# Patient Record
Sex: Female | Born: 1995 | Hispanic: Yes | Marital: Single | Smoking: Never smoker
Health system: Southern US, Community
[De-identification: ages and names within clinical notes are randomized; demographics above are authoritative.]

## PROBLEM LIST (undated history)

## (undated) DIAGNOSIS — E669 Obesity, unspecified: Secondary | ICD-10-CM

## (undated) DIAGNOSIS — R7303 Prediabetes: Secondary | ICD-10-CM

## (undated) HISTORY — DX: Prediabetes: R73.03

---

## 2018-12-23 ENCOUNTER — Emergency Department: Payer: HRSA Program

## 2018-12-23 ENCOUNTER — Emergency Department
Admission: EM | Admit: 2018-12-23 | Discharge: 2018-12-23 | Disposition: A | Discharge status: Home / Self Care | Payer: HRSA Program | Attending: Student | Admitting: Student

## 2018-12-23 ENCOUNTER — Other Ambulatory Visit: Payer: Self-pay

## 2018-12-23 DIAGNOSIS — U071 COVID-19: Secondary | ICD-10-CM | POA: Insufficient documentation

## 2018-12-23 DIAGNOSIS — R0602 Shortness of breath: Secondary | ICD-10-CM | POA: Diagnosis present

## 2018-12-23 DIAGNOSIS — R509 Fever, unspecified: Secondary | ICD-10-CM

## 2018-12-23 DIAGNOSIS — Z20822 Contact with and (suspected) exposure to covid-19: Secondary | ICD-10-CM

## 2018-12-23 LAB — CBC WITH DIFFERENTIAL/PLATELET
Abs Immature Granulocytes: 0.01 10*3/uL (ref 0.00–0.07)
Basophils Absolute: 0 10*3/uL (ref 0.0–0.1)
Basophils Relative: 1 %
Eosinophils Absolute: 0 10*3/uL (ref 0.0–0.5)
Eosinophils Relative: 0 %
HCT: 45.2 % (ref 36.0–46.0)
Hemoglobin: 14.9 g/dL (ref 12.0–15.0)
Immature Granulocytes: 0 %
Lymphocytes Relative: 37 %
Lymphs Abs: 1 10*3/uL (ref 0.7–4.0)
MCH: 28.8 pg (ref 26.0–34.0)
MCHC: 33 g/dL (ref 30.0–36.0)
MCV: 87.4 fL (ref 80.0–100.0)
Monocytes Absolute: 0.2 10*3/uL (ref 0.1–1.0)
Monocytes Relative: 8 %
Neutro Abs: 1.5 10*3/uL — ABNORMAL LOW (ref 1.7–7.7)
Neutrophils Relative %: 54 %
Platelets: 163 10*3/uL (ref 150–400)
RBC: 5.17 MIL/uL — ABNORMAL HIGH (ref 3.87–5.11)
RDW: 13.4 % (ref 11.5–15.5)
WBC: 2.7 10*3/uL — ABNORMAL LOW (ref 4.0–10.5)
nRBC: 0 % (ref 0.0–0.2)

## 2018-12-23 LAB — BASIC METABOLIC PANEL
Anion gap: 7 (ref 5–15)
BUN: 6 mg/dL (ref 6–20)
CO2: 24 mmol/L (ref 22–32)
Calcium: 8.5 mg/dL — ABNORMAL LOW (ref 8.9–10.3)
Chloride: 107 mmol/L (ref 98–111)
Creatinine, Ser: 0.55 mg/dL (ref 0.44–1.00)
GFR calc Af Amer: >60 mL/min (ref >60)
GFR calc non Af Amer: >60 mL/min (ref >60)
Glucose, Bld: 127 mg/dL — ABNORMAL HIGH (ref 70–99)
Potassium: 3.5 mmol/L (ref 3.5–5.1)
Sodium: 138 mmol/L (ref 135–145)

## 2018-12-23 LAB — POCT PREGNANCY, URINE: Preg Test, Ur: NEGATIVE

## 2018-12-23 MED ORDER — BENZONATATE 100 MG PO CAPS: 100.0000 mg | ORAL_CAPSULE | Freq: Three times a day (TID) | ORAL | 0 refills | Status: AC | PRN

## 2018-12-23 MED ORDER — AZITHROMYCIN 250 MG PO TABS: ORAL_TABLET | ORAL | 0 refills | Status: DC

## 2018-12-23 MED ORDER — ACETAMINOPHEN 325 MG PO TABS
650.0000 mg | ORAL_TABLET | Freq: Once | ORAL | Status: AC | PRN
  Administered 2018-12-23: 13:00:00 650 mg via ORAL
  Filled 2018-12-23: qty 2

## 2018-12-23 MED ORDER — IBUPROFEN 600 MG PO TABS
600.0000 mg | ORAL_TABLET | Freq: Once | ORAL | Status: AC
  Administered 2018-12-23: 600 mg via ORAL
  Filled 2018-12-23: qty 1

## 2018-12-23 NOTE — ED Provider Notes (Addendum)
[facility] Emergency Department Provider Note  ____________________________________________   First MD Initiated Contact with Patient 12/23/18 1341     (approximate)  I have reviewed the triage vital signs and the nursing notes.  History  Chief Complaint Shortness of Breath    HPI Nancy Weeks is a 23 y.o. female with no significant medical history presents to the emergency department for fevers, body aches, headache, shortness of breath, and non-productive cough in the context of a known COVID positive exposure.  Patient states her symptoms started on Sunday, 3 days ago.  Her step father, who lives in the same household and whom she has been around, tested positive for COVID approximately 2 weeks ago.  She denies any vomiting or diarrhea.  No history of DVT or PE.  She has not tried anything for her symptoms.         Past Medical Hx History reviewed. No pertinent past medical history.  Problem List There are no active problems to display for this patient.   Past Surgical Hx History reviewed. No pertinent surgical history.  Medications Prior to Admission medications   Medication Sig Start Date End Date Taking? Authorizing Provider  azithromycin (ZITHROMAX Z-PAK) 250 MG tablet Take 2 tablets (500 mg) on  Day 1,  followed by 1 tablet (250 mg) once daily on Days 2 through 5. 12/23/18 12/28/18  Miguel AschoffMonks,  L., MD    Allergies Patient has no known allergies.  Family Hx No family history on file.  Social Hx Social History   Tobacco Use  . Smoking status: Never Smoker  . Smokeless tobacco: Never Used  Substance Use Topics  . Alcohol use: Not Currently  . Drug use: Not Currently     Review of Systems  Constitutional: + for fever and body ache Eyes: Negative for visual changes. ENT: Negative for sore throat. Cardiovascular: Negative for chest pain. Respiratory: + for shortness of breath and cough. Gastrointestinal: Negative  for abdominal pain. Negative for nausea. Negative for vomiting. Genitourinary: Negative for dysuria. Musculoskeletal: Negative for leg swelling. Skin: Negative for rash. Neurological: + for for headaches.   Physical Exam  Vital Signs: ED Triage Vitals  Enc Vitals Group     BP 12/23/18 1243 (!) 143/82     Pulse Rate 12/23/18 1243 (!) 133     Resp 12/23/18 1243 (!) 48     Temp 12/23/18 1243 (!) 101.1 F (38.4 C)     Temp Source 12/23/18 1243 Oral     SpO2 12/23/18 1243 92 %     Weight 12/23/18 1243 280 lb (127 kg)     Height 12/23/18 1243 5\' 2"  (1.575 m)     Head Circumference --      Peak Flow --      Pain Score 12/23/18 1246 0     Pain Loc --      Pain Edu? --      Excl. in GC? --     Constitutional: Alert and oriented. Overweight.  Eyes: Conjunctivae clear. Sclera anicteric. Head: Normocephalic. Atraumatic. Nose: No congestion. No rhinorrhea. Mouth/Throat: MM moist.  Neck: No stridor.   Cardiovascular: Tachycardic, regular rhythm. No murmurs. Extremities well perfused. Respiratory: Mild tachypnea, but speaking in full sentences. No accessory muscle use. No respiratory distress.  Lungs clear bilaterally, anteriorly. Gastrointestinal: Soft and non-tender. No distention.  Musculoskeletal: No lower extremity edema or asymmetry. Neurologic:  Normal speech and language. No gross focal neurologic deficits are appreciated.  Skin: Skin is warm,  dry and intact. No rash noted. Psychiatric: Mood and affect are appropriate for situation.  EKG  Personally reviewed.   Rate: tachycardic, 130 Rhythm: sinus Axis: normal Intervals: within normal limits No acute ischemic changes Low voltage, perhaps 2/2 body type/habitus   Radiology  XR: IMPRESSION: Elevation of the right hemidiaphragm with adjacent hazy opacity likely reflective of atelectasis though can not exclude underlying consolidation/infection.   Procedures  Procedure(s) performed (including critical care):   Procedures   Initial Impression / Assessment and Plan / ED Course  23 y.o. female with no significant medical history who presents to the ED for fever, body ache, shortness of breath, cough x 3 days in the setting of a known COVID exposure.  On initial arrival, patient is febrile with an associated tachycardia and tachypnea.  Initial oxygen saturation 92%, however on recheck without intervention or supplemental oxygen she maintains oxygen saturation greater than 97% on room air throughout her ED stay.  Lungs clear, able to speak in full sentences, no accessory muscle use, and not in any respiratory distress.   Highest concern for COVID given her symptoms and exposure.  We will swab and obtain XR. Symptom control, reassess. No hx of DVT/PE, recent travel or immobilization, or leg swelling suggestive of PE.  After anti-pyretics her temperature, heart rate, and respiratory rate improved.  Labs without actionable derangements.  X-ray with questionable right sided consolidation. Given the possible consolidation on x-ray, will opt to treat with a course of azithromycin.    Vitals improved, at this time the patient feels comfortable (and prefers) going home.  Given her improvement (temp, HR, and RR) with antipyretics, as well as no evidence of respiratory distress or hypoxia, feel discharge is appropriate at this time. Discussed strict return precautions.  Patient voices understanding and is comfortable with the plan and discharge.   Final Clinical Impression(s) / ED Diagnosis  Final diagnoses:  SOB (shortness of breath)  Suspected Covid-19 Virus Infection  Fever in adult    Note:  This document was prepared using Dragon voice recognition software and may include unintentional dictation errors.     Lilia Pro., MD 12/23/18 1520

## 2018-12-23 NOTE — Discharge Instructions (Addendum)
Thank you for letting us take care of you in the emergency department today.   At this time, your coronavirus swab results are pending. You should hear your results in 2-4 days if they are positive.   In the meantime, it is important to take precautions in case you are positive. This includes quarantining, wearing a mask, and social distancing.   Continue to take over the counter acetaminophen (Tylenol) as directed on the box to help with fevers as well as aches and pains. It may help to take this on a scheduled basis every 4-6 hours for the next several days.  We will also send you home with an antibiotic called azithromycin, for a possible pneumonia on your chest x-ray, please take as directed for the entire course.  Please return to the ER for any new or worsening symptoms, such as difficulty breathing, vomiting and diarrhea, or chest pain.

## 2018-12-23 NOTE — ED Notes (Signed)
Pt states having fevers since yesterday and a cough that comes from feeling sob. Pt also states having headaches for the last few days and taking OTC meds for her symptoms. Father known to have covid around 2 weeks ago.

## 2018-12-23 NOTE — ED Triage Notes (Signed)
Pt c/o having a cough with SOB since Friday. Today febrile in triage 101.1, ST 133. States her step father that lives in the same household tested positive for covid about 2 weeks ago.

## 2018-12-24 LAB — NOVEL CORONAVIRUS, NAA (HOSP ORDER, SEND-OUT TO REF LAB; TAT 18-24 HRS): SARS-CoV-2, NAA: DETECTED — AB

## 2018-12-25 ENCOUNTER — Other Ambulatory Visit: Payer: Self-pay

## 2018-12-25 ENCOUNTER — Emergency Department
Admission: EM | Admit: 2018-12-25 | Discharge: 2018-12-25 | Disposition: A | Discharge status: Other Acute Inpatient Hospital | Payer: HRSA Program | Attending: Student in an Organized Health Care Education/Training Program | Admitting: Student in an Organized Health Care Education/Training Program

## 2018-12-25 ENCOUNTER — Emergency Department: Payer: HRSA Program

## 2018-12-25 ENCOUNTER — Encounter: Payer: Self-pay | Admitting: Emergency Medicine

## 2018-12-25 ENCOUNTER — Inpatient Hospital Stay (HOSPITAL_COMMUNITY)
Admission: AD | Admit: 2018-12-25 | Discharge: 2018-12-30 | DRG: 177 | Disposition: A | Discharge status: Home / Self Care | Payer: HRSA Program | Source: Other Acute Inpatient Hospital | Attending: Internal Medicine | Admitting: Internal Medicine

## 2018-12-25 ENCOUNTER — Encounter (HOSPITAL_COMMUNITY): Payer: Self-pay

## 2018-12-25 DIAGNOSIS — J1289 Other viral pneumonia: Secondary | ICD-10-CM | POA: Diagnosis present

## 2018-12-25 DIAGNOSIS — R0789 Other chest pain: Secondary | ICD-10-CM | POA: Insufficient documentation

## 2018-12-25 DIAGNOSIS — J1282 Pneumonia due to coronavirus disease 2019: Secondary | ICD-10-CM

## 2018-12-25 DIAGNOSIS — U071 COVID-19: Secondary | ICD-10-CM

## 2018-12-25 DIAGNOSIS — J9601 Acute respiratory failure with hypoxia: Secondary | ICD-10-CM | POA: Diagnosis present

## 2018-12-25 DIAGNOSIS — R6510 Systemic inflammatory response syndrome (SIRS) of non-infectious origin without acute organ dysfunction: Secondary | ICD-10-CM | POA: Diagnosis present

## 2018-12-25 DIAGNOSIS — R7989 Other specified abnormal findings of blood chemistry: Secondary | ICD-10-CM | POA: Diagnosis not present

## 2018-12-25 DIAGNOSIS — R0602 Shortness of breath: Secondary | ICD-10-CM | POA: Diagnosis present

## 2018-12-25 DIAGNOSIS — Z6841 Body Mass Index (BMI) 40.0 and over, adult: Secondary | ICD-10-CM | POA: Diagnosis present

## 2018-12-25 DIAGNOSIS — K760 Fatty (change of) liver, not elsewhere classified: Secondary | ICD-10-CM | POA: Diagnosis present

## 2018-12-25 HISTORY — DX: Acute respiratory failure with hypoxia: J96.01

## 2018-12-25 HISTORY — DX: Systemic inflammatory response syndrome (sirs) of non-infectious origin without acute organ dysfunction: R65.10

## 2018-12-25 HISTORY — DX: Obesity, unspecified: E66.9

## 2018-12-25 HISTORY — DX: COVID-19: U07.1

## 2018-12-25 LAB — CBC WITH DIFFERENTIAL/PLATELET
Abs Immature Granulocytes: 0.01 10*3/uL (ref 0.00–0.07)
Basophils Absolute: 0 10*3/uL (ref 0.0–0.1)
Basophils Relative: 1 %
Eosinophils Absolute: 0 10*3/uL (ref 0.0–0.5)
Eosinophils Relative: 0 %
HCT: 42.4 % (ref 36.0–46.0)
Hemoglobin: 14 g/dL (ref 12.0–15.0)
Immature Granulocytes: 0 %
Lymphocytes Relative: 31 %
Lymphs Abs: 1.1 10*3/uL (ref 0.7–4.0)
MCH: 28.4 pg (ref 26.0–34.0)
MCHC: 33 g/dL (ref 30.0–36.0)
MCV: 86 fL (ref 80.0–100.0)
Monocytes Absolute: 0.3 10*3/uL (ref 0.1–1.0)
Monocytes Relative: 10 %
Neutro Abs: 1.9 10*3/uL (ref 1.7–7.7)
Neutrophils Relative %: 58 %
Platelets: 172 10*3/uL (ref 150–400)
RBC: 4.93 MIL/uL (ref 3.87–5.11)
RDW: 13.7 % (ref 11.5–15.5)
WBC: 3.3 10*3/uL — ABNORMAL LOW (ref 4.0–10.5)
nRBC: 0 % (ref 0.0–0.2)

## 2018-12-25 LAB — FERRITIN: Ferritin: 118 ng/mL (ref 11–307)

## 2018-12-25 LAB — COMPREHENSIVE METABOLIC PANEL
ALT: 70 U/L — ABNORMAL HIGH (ref 0–44)
AST: 44 U/L — ABNORMAL HIGH (ref 15–41)
Albumin: 4 g/dL (ref 3.5–5.0)
Alkaline Phosphatase: 63 U/L (ref 38–126)
Anion gap: 10 (ref 5–15)
BUN: 6 mg/dL (ref 6–20)
CO2: 24 mmol/L (ref 22–32)
Calcium: 8.4 mg/dL — ABNORMAL LOW (ref 8.9–10.3)
Chloride: 105 mmol/L (ref 98–111)
Creatinine, Ser: 0.55 mg/dL (ref 0.44–1.00)
GFR calc Af Amer: >60 mL/min (ref >60)
GFR calc non Af Amer: >60 mL/min (ref >60)
Glucose, Bld: 119 mg/dL — ABNORMAL HIGH (ref 70–99)
Potassium: 3.5 mmol/L (ref 3.5–5.1)
Sodium: 139 mmol/L (ref 135–145)
Total Bilirubin: 0.5 mg/dL (ref 0.3–1.2)
Total Protein: 7.6 g/dL (ref 6.5–8.1)

## 2018-12-25 LAB — PROCALCITONIN: Procalcitonin: <0.1 ng/mL

## 2018-12-25 LAB — FIBRIN DERIVATIVES D-DIMER (ARMC ONLY): Fibrin derivatives D-dimer (ARMC): 646.18 ng/mL (FEU) — ABNORMAL HIGH (ref 0.00–499.00)

## 2018-12-25 LAB — C-REACTIVE PROTEIN: CRP: 2.7 mg/dL — ABNORMAL HIGH (ref <1.0)

## 2018-12-25 LAB — LACTIC ACID, PLASMA: Lactic Acid, Venous: 1 mmol/L (ref 0.5–1.9)

## 2018-12-25 MED ORDER — ZINC SULFATE 220 (50 ZN) MG PO CAPS
220.0000 mg | ORAL_CAPSULE | Freq: Every day | ORAL | Status: DC
  Administered 2018-12-25 – 2018-12-30 (×6): 220 mg via ORAL
  Filled 2018-12-25 (×6): qty 1

## 2018-12-25 MED ORDER — SODIUM CHLORIDE 0.9 % IV SOLN
100.0000 mg | INTRAVENOUS | Status: AC
  Administered 2018-12-26 – 2018-12-29 (×4): 100 mg via INTRAVENOUS
  Filled 2018-12-25 (×5): qty 20

## 2018-12-25 MED ORDER — SODIUM CHLORIDE 0.9 % IV SOLN
200.0000 mg | Freq: Once | INTRAVENOUS | Status: AC
  Administered 2018-12-25: 200 mg via INTRAVENOUS
  Filled 2018-12-25: qty 40

## 2018-12-25 MED ORDER — VITAMIN C 500 MG PO TABS
500.0000 mg | ORAL_TABLET | Freq: Every day | ORAL | Status: DC
  Administered 2018-12-25 – 2018-12-30 (×6): 500 mg via ORAL
  Filled 2018-12-25 (×6): qty 1

## 2018-12-25 MED ORDER — IOHEXOL 350 MG/ML SOLN
75.0000 mL | Freq: Once | INTRAVENOUS | Status: AC | PRN
  Administered 2018-12-25: 75 mL via INTRAVENOUS

## 2018-12-25 MED ORDER — ACETAMINOPHEN 325 MG PO TABS
650.0000 mg | ORAL_TABLET | Freq: Four times a day (QID) | ORAL | Status: DC | PRN
  Administered 2018-12-25: 650 mg via ORAL

## 2018-12-25 MED ORDER — ALBUTEROL SULFATE HFA 108 (90 BASE) MCG/ACT IN AERS
2.0000 | INHALATION_SPRAY | Freq: Once | RESPIRATORY_TRACT | Status: AC
  Administered 2018-12-25: 2 via RESPIRATORY_TRACT
  Filled 2018-12-25: qty 6.7

## 2018-12-25 MED ORDER — ONDANSETRON HCL 4 MG PO TABS: 4.0000 mg | ORAL_TABLET | Freq: Four times a day (QID) | ORAL | Status: DC | PRN

## 2018-12-25 MED ORDER — AZITHROMYCIN 500 MG PO TABS
ORAL_TABLET | ORAL | Status: AC
  Filled 2018-12-25: qty 1

## 2018-12-25 MED ORDER — HYDROCOD POLST-CPM POLST ER 10-8 MG/5ML PO SUER
5.0000 mL | Freq: Two times a day (BID) | ORAL | Status: DC | PRN
  Administered 2018-12-26 – 2018-12-28 (×5): 5 mL via ORAL
  Filled 2018-12-25 (×5): qty 5

## 2018-12-25 MED ORDER — SODIUM CHLORIDE 0.9 % IV SOLN
INTRAVENOUS | Status: AC
  Administered 2018-12-25 – 2018-12-26 (×2): via INTRAVENOUS

## 2018-12-25 MED ORDER — SODIUM CHLORIDE 0.9 % IV BOLUS
500.0000 mL | Freq: Once | INTRAVENOUS | Status: AC
  Administered 2018-12-25: 13:00:00 500 mL via INTRAVENOUS

## 2018-12-25 MED ORDER — CLONAZEPAM 0.5 MG PO TABS
0.5000 mg | ORAL_TABLET | Freq: Two times a day (BID) | ORAL | Status: DC | PRN
  Administered 2018-12-26 – 2018-12-28 (×4): 0.5 mg via ORAL
  Filled 2018-12-25 (×4): qty 1

## 2018-12-25 MED ORDER — SODIUM CHLORIDE 0.9% FLUSH
3.0000 mL | Freq: Two times a day (BID) | INTRAVENOUS | Status: DC
  Administered 2018-12-26 – 2018-12-30 (×9): 3 mL via INTRAVENOUS

## 2018-12-25 MED ORDER — ENOXAPARIN SODIUM 60 MG/0.6ML ~~LOC~~ SOLN
60.0000 mg | SUBCUTANEOUS | Status: DC
  Administered 2018-12-25 – 2018-12-28 (×4): 60 mg via SUBCUTANEOUS
  Filled 2018-12-25 (×4): qty 0.6

## 2018-12-25 MED ORDER — METHYLPREDNISOLONE SODIUM SUCC 125 MG IJ SOLR
60.0000 mg | Freq: Two times a day (BID) | INTRAMUSCULAR | Status: DC
  Administered 2018-12-25 – 2018-12-28 (×6): 60 mg via INTRAVENOUS
  Filled 2018-12-25 (×6): qty 2

## 2018-12-25 MED ORDER — ALBUTEROL SULFATE HFA 108 (90 BASE) MCG/ACT IN AERS
2.0000 | INHALATION_SPRAY | RESPIRATORY_TRACT | Status: DC | PRN
  Administered 2018-12-25 – 2018-12-26 (×3): 2 via RESPIRATORY_TRACT
  Filled 2018-12-25: qty 6.7

## 2018-12-25 MED ORDER — TRAMADOL HCL 50 MG PO TABS
50.0000 mg | ORAL_TABLET | Freq: Four times a day (QID) | ORAL | Status: DC | PRN
  Administered 2018-12-26: 50 mg via ORAL
  Filled 2018-12-25: qty 1

## 2018-12-25 MED ORDER — DEXAMETHASONE SODIUM PHOSPHATE 10 MG/ML IJ SOLN
10.0000 mg | Freq: Once | INTRAMUSCULAR | Status: AC
  Administered 2018-12-25: 14:00:00 10 mg via INTRAVENOUS
  Filled 2018-12-25: qty 1

## 2018-12-25 MED ORDER — SODIUM CHLORIDE 0.9 % IV SOLN
Freq: Once | INTRAVENOUS | Status: AC
  Administered 2018-12-25: 15:00:00 via INTRAVENOUS

## 2018-12-25 MED ORDER — POLYETHYLENE GLYCOL 3350 17 G PO PACK: 17.0000 g | PACK | Freq: Every day | ORAL | Status: DC | PRN

## 2018-12-25 MED ORDER — ONDANSETRON HCL 4 MG/2ML IJ SOLN: 4.0000 mg | Freq: Four times a day (QID) | INTRAMUSCULAR | Status: DC | PRN

## 2018-12-25 MED ORDER — GUAIFENESIN-DM 100-10 MG/5ML PO SYRP
10.0000 mL | ORAL_SOLUTION | ORAL | Status: DC | PRN
  Administered 2018-12-25 – 2018-12-30 (×5): 10 mL via ORAL
  Filled 2018-12-25 (×5): qty 10

## 2018-12-25 MED ORDER — ACETAMINOPHEN 500 MG PO TABS
1000.0000 mg | ORAL_TABLET | Freq: Once | ORAL | Status: AC
  Administered 2018-12-25: 1000 mg via ORAL
  Filled 2018-12-25: qty 2

## 2018-12-25 MED ORDER — ACETAMINOPHEN 325 MG PO TABS
650.0000 mg | ORAL_TABLET | Freq: Four times a day (QID) | ORAL | Status: DC | PRN
  Filled 2018-12-25: qty 2

## 2018-12-25 MED ORDER — AZITHROMYCIN 500 MG PO TABS
500.0000 mg | ORAL_TABLET | Freq: Once | ORAL | Status: AC
  Administered 2018-12-25: 500 mg via ORAL
  Filled 2018-12-25: qty 1

## 2018-12-25 NOTE — ED Notes (Signed)
Pt oob in room. O2 sats 90% RA, HR 138, resp 45 bpm

## 2018-12-25 NOTE — ED Notes (Signed)
Topaz not working. Pt unable to sign paper due to covid +. Pt verbalized acceptance of transfer to me.

## 2018-12-25 NOTE — ED Provider Notes (Signed)
[facility] Emergency Department Provider Note    First MD Initiated Contact with Patient 12/25/18 1232     (approximate)  I have reviewed the triage vital signs and the nursing notes.   HISTORY  Chief Complaint Cough and Shortness of Breath    HPI Nancy Weeks is a 23 y.o. female recent diagnosis of COVID-19 with flulike illness starting over the weekend presents to the ER for worsening shortness of breath and chest discomfort.  States he is having high fevers feels fatigued and weak.  Is having nonproductive cough.  Denies any pain when taking deep inspirations.  Denies any history of asthma or COPD.  She denies any smoking.    History reviewed. No pertinent past medical history. No family history on file. History reviewed. No pertinent surgical history. There are no active problems to display for this patient.     Prior to Admission medications   Medication Sig Start Date End Date Taking? Authorizing Provider  azithromycin (ZITHROMAX Z-PAK) 250 MG tablet Take 2 tablets (500 mg) on  Day 1,  followed by 1 tablet (250 mg) once daily on Days 2 through 5. 12/23/18 12/28/18 Yes Miguel Aschoff., MD  benzonatate (TESSALON PERLES) 100 MG capsule Take 1 capsule (100 mg total) by mouth 3 (three) times daily as needed for cough. 12/23/18 12/23/19 Yes Miguel Aschoff., MD    Allergies Patient has no known allergies.    Social History Social History   Tobacco Use  . Smoking status: Never Smoker  . Smokeless tobacco: Never Used  Substance Use Topics  . Alcohol use: Not Currently  . Drug use: Not Currently    Review of Systems Patient denies headaches, rhinorrhea, blurry vision, numbness, shortness of breath, chest pain, edema, cough, abdominal pain, nausea, vomiting, diarrhea, dysuria, fevers, rashes or hallucinations unless otherwise stated above in HPI. ____________________________________________   PHYSICAL EXAM:  VITAL SIGNS: Vitals:    12/25/18 1445 12/25/18 1500  BP:  128/83  Pulse: (!) 113 (!) 113  Resp: (!) 36 (!) 38  Temp:    SpO2: 92% 93%    Constitutional: Alert and oriented. Tachypneic, ill appearing  Eyes: Conjunctivae are normal.  Head: Atraumatic. Nose: No congestion/rhinnorhea. Mouth/Throat: Mucous membranes are moist.   Neck: No stridor. Painless ROM.  Cardiovascular: Normal rate, regular rhythm. Grossly normal heart sounds.  Good peripheral circulation. Respiratory: tachypnea with shallow inspirations, no wheezing or rhonchi appreciated.  Gastrointestinal: Soft and nontender. No distention. No abdominal bruits. No CVA tenderness. Genitourinary: deferred Musculoskeletal: No lower extremity tenderness nor edema.  No joint effusions. Neurologic:  Normal speech and language. No gross focal neurologic deficits are appreciated. No facial droop Skin:  Skin is warm, dry and intact. No rash noted. Psychiatric: Mood and affect are normal. Speech and behavior are normal.  ____________________________________________   LABS (all labs ordered are listed, but only abnormal results are displayed)  Results for orders placed or performed during the hospital encounter of 12/25/18 (from the past 24 hour(s))  CBC with Differential/Platelet     Status: Abnormal   Collection Time: 12/25/18 12:56 PM  Result Value Ref Range   WBC 3.3 (L) 4.0 - 10.5 K/uL   RBC 4.93 3.87 - 5.11 MIL/uL   Hemoglobin 14.0 12.0 - 15.0 g/dL   HCT 03.5 00.9 - 38.1 %   MCV 86.0 80.0 - 100.0 fL   MCH 28.4 26.0 - 34.0 pg   MCHC 33.0 30.0 - 36.0 g/dL   RDW 82.9 93.7 -  15.5 %   Platelets 172 150 - 400 K/uL   nRBC 0.0 0.0 - 0.2 %   Neutrophils Relative % 58 %   Neutro Abs 1.9 1.7 - 7.7 K/uL   Lymphocytes Relative 31 %   Lymphs Abs 1.1 0.7 - 4.0 K/uL   Monocytes Relative 10 %   Monocytes Absolute 0.3 0.1 - 1.0 K/uL   Eosinophils Relative 0 %   Eosinophils Absolute 0.0 0.0 - 0.5 K/uL   Basophils Relative 1 %   Basophils Absolute 0.0 0.0 -  0.1 K/uL   Immature Granulocytes 0 %   Abs Immature Granulocytes 0.01 0.00 - 0.07 K/uL  Comprehensive metabolic panel     Status: Abnormal   Collection Time: 12/25/18 12:56 PM  Result Value Ref Range   Sodium 139 135 - 145 mmol/L   Potassium 3.5 3.5 - 5.1 mmol/L   Chloride 105 98 - 111 mmol/L   CO2 24 22 - 32 mmol/L   Glucose, Bld 119 (H) 70 - 99 mg/dL   BUN 6 6 - 20 mg/dL   Creatinine, Ser 0.55 0.44 - 1.00 mg/dL   Calcium 8.4 (L) 8.9 - 10.3 mg/dL   Total Protein 7.6 6.5 - 8.1 g/dL   Albumin 4.0 3.5 - 5.0 g/dL   AST 44 (H) 15 - 41 U/L   ALT 70 (H) 0 - 44 U/L   Alkaline Phosphatase 63 38 - 126 U/L   Total Bilirubin 0.5 0.3 - 1.2 mg/dL   GFR calc non Af Amer >60 >60 mL/min   GFR calc Af Amer >60 >60 mL/min   Anion gap 10 5 - 15   ____________________________________________  EKG My review and personal interpretation at Time: 12:47   Indication: sob  Rate: 125  Rhythm: sinus Axis: normal Other: normal intervals, no stemi ____________________________________________  RADIOLOGY  I personally reviewed all radiographic images ordered to evaluate for the above acute complaints and reviewed radiology reports and findings.  These findings were personally discussed with the patient.  Please see medical record for radiology report.  ____________________________________________   PROCEDURES  Procedure(s) performed:  Procedures    Critical Care performed: no ____________________________________________   INITIAL IMPRESSION / ASSESSMENT AND PLAN / ED COURSE  Pertinent labs & imaging results that were available during my care of the patient were reviewed by me and considered in my medical decision making (see chart for details).   DDX: covid, pna, chf, asthma, pe  Nancy Weeks is a 23 y.o. who presents to the ED with symptoms as described above.  Patient is ill-appearing febrile tachycardic tachypneic with use of accessory muscles.  Certainly concerning for COVID  pneumonitis.  Blood will be sent for the by differential.  Will provide nebulizer as well as Decadron and azithromycin.  Clinical Course as of Dec 24 1509  Thu Dec 25, 2018  1444 Patient after taking only a few steps became markedly tachypneic with respirations in the 40s to 50s.  O2 saturation dropped 89 to 90%.  Patient felt that she was about to pass out she was returned to the bed.  Based on her rapid progression of her illness will discuss with hospitalist for admission.   [PR]  1508 Discussed case with hospitalist at [facility]  They have requested CT imaging to both evaluate for PE as well as infiltrates given her tachypnea she may meet criteria for him to severe.  Will order CT angiogram.  Patient otherwise stable and appropriate transfer.   [PR]  Clinical Course User Index [PR] Willy Eddyobinson, Kinsler Soeder, MD    The patient was evaluated in Emergency Department today for the symptoms described in the history of present illness. He/she was evaluated in the context of the global COVID-19 pandemic, which necessitated consideration that the patient might be at risk for infection with the SARS-CoV-2 virus that causes COVID-19. Institutional protocols and algorithms that pertain to the evaluation of patients at risk for COVID-19 are in a state of rapid change based on information released by regulatory bodies including the CDC and federal and state organizations. These policies and algorithms were followed during the patient's care in the ED.  As part of my medical decision making, I reviewed the following data within the electronic MEDICAL RECORD NUMBER Nursing notes reviewed and incorporated, Labs reviewed, notes from prior ED visits and [place] Controlled Substance Database   ____________________________________________   FINAL CLINICAL IMPRESSION(S) / ED DIAGNOSES  Final diagnoses:  COVID-19 virus infection  Shortness of breath      NEW MEDICATIONS STARTED DURING THIS VISIT:  New  Prescriptions   No medications on file     Note:  This document was prepared using Dragon voice recognition software and may include unintentional dictation errors.    Willy Eddyobinson, Vannie Hilgert, MD 12/25/18 [phone]

## 2018-12-25 NOTE — Progress Notes (Signed)
Pharmacy Antibiotic Note  Nancy Weeks is a 23 y.o. female admitted on 12/25/2018 with COVID 19.  Pharmacy has been consulted for Remdesivir dosing.  CT shows ground-glass opacity throughout much of the right lung withmultifocal consolidation predominantly in the right lower lobe butalso smaller areas in the right lung apex and posterior left lower lobe suspicious for multifocal pneumonia Pt requiring supplemental oxygen (2L [place]) ALT 70  Plan: Remdesivir 200mg  IV now then 100mg  IV daily x 4 days Will f/u ALT and pt's clinical condition   Height: 5\' 2"  (157.5 cm) Weight: 279 lb 15.8 oz (127 kg) IBW/kg (Calculated) : 50.1  Temp (24hrs), Avg:100.3 F (37.9 C), Min:98.5 F (36.9 C), Max:102.3 F (39.1 C)  Recent Labs  Lab 12/23/18 1306 12/25/18 1256 12/25/18 1500  WBC 2.7* 3.3*  --   CREATININE 0.55 0.55  --   LATICACIDVEN  --   --  1.0    Estimated Creatinine Clearance: 142.1 mL/min (by C-G formula based on SCr of 0.55 mg/dL).    No Known Allergies  Antimicrobials this admission: 8/27 Azith x 1 8/27 Remdesivir >> 8/31   Thank you for allowing pharmacy to be a part of this patient's care.  Sherlon Handing, PharmD, BCPS CGV Clinical pharmacist phone [phone] 12/25/2018 6:41 PM

## 2018-12-25 NOTE — ED Triage Notes (Signed)
Pt presents to ED via POV with c/o fever, SOB, and cough. Pt with noted SOB in triage, and febrile, pt states tested 2 days when she was here, however was not told her results, with with +Covid test on 8/25.

## 2018-12-25 NOTE — H&P (Addendum)
HISTORY AND PHYSICAL       PATIENT DETAILS Name: Nancy Weeks Age: 23 y.o. Sex: female Date of Birth: May 31, 1995 Admit Date: 12/25/2018 ZOX:[id number]:System, Pcp Not In   Patient coming from: [facility]   CHIEF COMPLAINT:  Fever, shortness of breath, generalized myalgias since this past Sunday (past 4-5 days)  HPI: Nancy Weeks is a 23 y.o. female with medical history significant of obesity who [facility] as a [facility] for evaluation of the above-noted complaints.  Per patient-her stepfather recently was diagnosed with COVID 19.  Starting this past Sunday, she started having a dry cough, myalgias and she started having some shortness of breath, hence presented to the ED on 8/25 where she was diagnosed with COVID-19-started on Zithromax and Tessalon-and sent home.  Upon going home, her shortness of breath continued to worsen to the extent that even walking a few feet made her very short of breath.  She subsequently represented to the emergency room on 8/27-she was found to be hypoxic with minimal ambulation (down to the high 80s 80s)-but with stable at rest with 2 L of oxygen.  She was also found to be febrile.  CT angiogram of the chest did not show PE, but showed bilateral groundglass opacities consistent with COVID-19 pneumonia.  [facility] was subsequently [facility] evaluation and treatment.  ED Course:  Given 1 dose of Decadron-CT angiogram of the chest showed multifocal pneumonia-but was negative for large PE.  Note: Lives at: Home Mobility:  Independent Chronic Indwelling Foley:no   REVIEW OF SYSTEMS:  Constitutional:   No  weight loss  HEENT:    No headaches, Dysphagia,Tooth/dental problems,Sore throat  Cardio-vascular: No chest pain,Orthopnea, PND,lower extremity edema, anasarca, palpitations  GI:  No heartburn, indigestion, abdominal pain, nausea, vomiting, diarrhea, melena or hematochezia   Resp: No  hemoptysis,plueritic chest pain.   Skin:  No rash or lesions.  GU:  No dysuria, change in color of urine, no urgency or frequency.  No flank pain.  Musculoskeletal: No joint pain or swelling.  No decreased range of motion.  No back pain.  Endocrine: No heat intolerance, no cold intolerance, no polyuria, no polydipsia  Psych: No change in mood or affect. No depression or anxiety.  No memory loss.   ALLERGIES:  No Known Allergies  PAST MEDICAL HISTORY: Past Medical History:  Diagnosis Date  . Obesity     PAST SURGICAL HISTORY: No past surgical history on file.  MEDICATIONS AT HOME: Prior to Admission medications   Medication Sig Start Date End Date Taking? Authorizing Provider  azithromycin (ZITHROMAX Z-PAK) 250 MG tablet Take 2 tablets (500 mg) on  Day 1,  followed by 1 tablet (250 mg) once daily on Days 2 through 5. 12/23/18 12/28/18 Yes Miguel AschoffMonks, Sarah L., MD  benzonatate (TESSALON PERLES) 100 MG capsule Take 1 capsule (100 mg total) by mouth 3 (three) times daily as needed for cough. 12/23/18 12/23/19 Yes Miguel AschoffMonks, Sarah L., MD    FAMILY HISTORY: Grandmother-breast cancer  SOCIAL HISTORY:  reports that she has never smoked. She has never used smokeless tobacco. She reports previous alcohol use. She reports previous drug use.  PHYSICAL EXAM: Blood pressure 110/63, pulse (!) 111, temperature 98.5 F (36.9 C), temperature source Oral, resp. rate (!) 31, SpO2 92 %.  General appearance :Awake, alert, not in any distress.  Eyes:, pupils equally reactive to light and accomodation,no scleral icterus.Pink conjunctiva HEENT: Atraumatic and  Normocephalic Neck: supple, no JVD.  Resp:Good air entry bilaterally, no added sounds  CVS: S1 S2 regular, no murmurs.  GI: Bowel sounds present, Non tender and not distended with no gaurding, rigidity or rebound. Extremities: B/L Lower Ext shows no edema, both legs are warm to touch Neurology:  speech clear,Non focal, sensation is  grossly intact. Psychiatric: Normal judgment and insight. Alert and oriented x 3. Normal mood. Musculoskeletal:gait appears to be normal.No digital cyanosis Skin:No Rash, warm and dry Wounds:N/A  LABS ON ADMISSION:  I have personally reviewed following labs and imaging studies  CBC: Recent Labs  Lab 12/23/18 1306 12/25/18 1256  WBC 2.7* 3.3*  NEUTROABS 1.5* 1.9  HGB 14.9 14.0  HCT 45.2 42.4  MCV 87.4 86.0  PLT 163 172    Basic Metabolic Panel: Recent Labs  Lab 12/23/18 1306 12/25/18 1256  NA 138 139  K 3.5 3.5  CL 107 105  CO2 24 24  GLUCOSE 127* 119*  BUN 6 6  CREATININE 0.55 0.55  CALCIUM 8.5* 8.4*    GFR: Estimated Creatinine Clearance: 142.1 mL/min (by C-G formula based on SCr of 0.55 mg/dL).  Liver Function Tests: Recent Labs  Lab 12/25/18 1256  AST 44*  ALT 70*  ALKPHOS 63  BILITOT 0.5  PROT 7.6  ALBUMIN 4.0   No results for input(s): LIPASE, AMYLASE in the last 168 hours. No results for input(s): AMMONIA in the last 168 hours.  Coagulation Profile: No results for input(s): INR, PROTIME in the last 168 hours.  Cardiac Enzymes: No results for input(s): CKTOTAL, CKMB, CKMBINDEX, TROPONINI in the last 168 hours.  BNP (last 3 results) No results for input(s): PROBNP in the last 8760 hours.  HbA1C: No results for input(s): HGBA1C in the last 72 hours.  CBG: No results for input(s): GLUCAP in the last 168 hours.  Lipid Profile: No results for input(s): CHOL, HDL, LDLCALC, TRIG, CHOLHDL, LDLDIRECT in the last 72 hours.  Thyroid Function Tests: No results for input(s): TSH, T4TOTAL, FREET4, T3FREE, THYROIDAB in the last 72 hours.  Anemia Panel: Recent Labs    12/25/18 1459  FERRITIN 118    Urine analysis: No results found for: COLORURINE, APPEARANCEUR, LABSPEC, PHURINE, GLUCOSEU, HGBUR, BILIRUBINUR, KETONESUR, PROTEINUR, UROBILINOGEN, NITRITE, LEUKOCYTESUR  Sepsis Labs: Lactic Acid, Venous    Component Value Date/Time    LATICACIDVEN 1.0 12/25/2018 1500     Microbiology: Recent Results (from the past 240 hour(s))  Novel Coronavirus, NAA (Hosp order, Send-out to Ref Lab; TAT 18-24 hrs     Status: Abnormal   Collection Time: 12/23/18  2:57 PM   Specimen: Nasopharyngeal Swab; Respiratory  Result Value Ref Range Status   SARS-CoV-2, NAA DETECTED (A) NOT DETECTED Final    Comment: CRITICAL RESULT CALLED TO, READ BACK BY AND VERIFIED WITH: TIFFANY JOHNSON 12/24/2018 1300 HKP (NOTE)                  Client Requested Flag This test was developed and its performance characteristics determined by [employer]. This test has not been FDA cleared or approved. This test has been authorized by FDA under an Emergency Use Authorization (EUA). This test is only authorized for the duration of time the declaration that circumstances exist justifying the authorization of the emergency use of in vitro diagnostic tests for detection of SARS-CoV-2 virus and/or diagnosis of COVID-19 infection under section 564(b)(1) of the Act, 21 U.S.C. 517GYF-7(C)(9), unless the authorization is terminated or revoked sooner. When diagnostic testing is negative, the possibility  of a false negative result should be considered in the context of a patient's recent exposures and the presence of clinical signs and symptoms consistent with COVID-19. An individual without symptoms  of COVID-19 and who is not shedding SARS-CoV-2 virus would expect to have a negative (not detected) result in this assay. Performed At: [facility] [address] [place], [place] [id number] Rush Farmer MD ME:[id number]    [place]  Final    Comment: Performed at [facility], [place], [place] [address]      RADIOLOGIC STUDIES ON ADMISSION: Ct Angio Chest Pe W And/or Wo Contrast  Result Date: 12/25/2018 CLINICAL DATA:  Pt presents to ED via POV with c/o fever, SOB, and cough. Pt with noted  SOB in triage, and febrile, pt states tested 2 days when she was here, however was not told her results, with +Covid test on 8/25. 64ml of OMni 350 used.^61mL OMNIPAQUE IOHEXOL 350 MG/ML SOLN EXAM: CT ANGIOGRAPHY CHEST WITH CONTRAST TECHNIQUE: Multidetector CT imaging of the chest was performed using the standard protocol during bolus administration of intravenous contrast. Multiplanar CT image reconstructions and MIPs were obtained to evaluate the vascular anatomy. CONTRAST:  77mL OMNIPAQUE IOHEXOL 350 MG/ML SOLN COMPARISON:  Same day chest radiograph FINDINGS: Cardiovascular: Suboptimal opacification of the pulmonary arteries limiting evaluation beyond the central vessels. There is no large central pulmonary embolus. Mediastinum/Nodes: No enlarged mediastinal, hilar, or axillary lymph nodes. Thyroid gland, trachea, and esophagus demonstrate no significant findings. Lungs/Pleura: There is ground-glass opacity throughout the right apically and posterior lung as well as in the left lower lobe. There are consolidative opacities in the right lung base as well as smaller area in the posterior right apex and posterior left lower lobe suspicious for multifocal pneumonia. No pneumothorax or pleural effusion. Upper Abdomen: Fatty infiltration of the liver. Musculoskeletal: No chest wall abnormality. No acute or significant osseous findings. Review of the MIP images confirms the above findings. IMPRESSION: 1. Suboptimal opacification of the pulmonary arteries significantly limiting evaluation beyond the central vessels. No large central pulmonary embolus. 2. Ground-glass opacity throughout much of the right lung with multifocal consolidation predominantly in the right lower lobe but also smaller areas in the right lung apex and posterior left lower lobe suspicious for multifocal pneumonia. 3. Hepatic steatosis. Electronically Signed   By: Audie Pinto M.D.   On: 12/25/2018 15:49   Dg Chest Portable 1 View  Result  Date: 12/25/2018 CLINICAL DATA:  Cough, shortness of breath, evaluate for pneumonia, positive COVID-19 EXAM: PORTABLE CHEST 1 VIEW COMPARISON:  12/23/2018 FINDINGS: Low volume AP portable examination, not significantly changed compared to prior examination. No acute appearing airspace opacity. IMPRESSION: Low volume AP portable examination, not significantly changed compared to prior examination. No acute appearing airspace opacity. Electronically Signed   By: Eddie Candle M.D.   On: 12/25/2018 12:55    I have personally reviewed images of CT angiogram of the chest: Plus pneumonia mostly in the right lobe-no obvious central PE.  EKG:  Personally reviewed.  Sinus tach  ASSESSMENT AND PLAN: Acute hypoxic respiratory failure: Secondary to COVID-19 pneumonia-CTA chest without any obvious large PE (but poor quality-hence will obtain lower extremity Doppler).  Plan is to start Solu-Medrol, Remdesivir-and follow clinical course.  If patient's hypoxia worsens-she has consented to the use of Actemra (no history of TB or hepatitis B in the past).  Await inflammatory markers.  The rationale for the off label use of Actemra its known side  effects, potential benefits was  discussed with patient.The use of Actemra is based on published clinical articles/anecdotal data as randomized trials are still ongoing (patient aware that some studies are negative so far). Complete risks and long-term side effects are unknown, however in the best clinical judgment it is felt that the clinical benefit at this time outweighs medical risks given tenuous clinical state of the patient.  Patient agree's with the treatment plan and consent to the use of Actemra.  Systemic inflammatory response syndrome: Secondary to COVID-19 pneumonia-supportive care with Remdesivir, IV fluids.  Will follow blood cultures.  Lactic acid and procalcitonin negative.  Morbid obesity: We will counseled regarding importance of weight loss  Further plan  will depend as patient's clinical course evolves and further radiologic and laboratory data become available. Patient will be monitored closely.  Above noted plan was discussed with patient at bedside-she was in agreement.  Note: Unable to contact patient's sister-left voicemail.   CONSULTS: None  DVT Prophylaxis: Prophylactic Lovenox   Code Status: Full Code  Disposition Plan:  Discharge back home  possibly in 5-6 days, depending on clinical course  Admission status: Inpatient  going to tele  The medical decision making on this patient was of high complexity and the patient is at high risk for clinical deterioration, therefore this is a level 3 visit.   Total time spent  55 minutes.Greater than 50% of this time was spent in counseling, explanation of diagnosis, planning of further management, and coordination of care.  Severity of illness: The appropriate patient status for this patient is INPATIENT. Inpatient status is judged to be reasonable and necessary in order to provide the required intensity of service to ensure the patient's safety. The patient's presenting symptoms, physical exam findings, and initial radiographic and laboratory data in the context of their chronic comorbidities is felt to place them at high risk for further clinical deterioration. Furthermore, it is not anticipated that the patient will be medically stable for discharge from the hospital within 2 midnights of admission. The following factors support the patient status of inpatient.   " The patient's presenting symptoms include fever, shortness of breath " The worrisome physical exam findings include tachypnea and hypoxemia " The initial radiographic and laboratory data are worrisome because of pneumonia on imaging " The chronic co-morbidities include morbid obesity   * I certify that at the point of admission it is my clinical judgment that the patient will require inpatient hospital care spanning beyond 2  midnights from the point of admission due to high intensity of service, high risk for further deterioration and high frequency of surveillance required.  Jeoffrey MassedShanker  [facility] Pager [phone]  If 7PM-7AM, please contact night-coverage  Please page via [email or web address]  Go to [email or web address] and use [facility]'s universal password to access. If you do not have the password, please contact the hospital operator.  Locate the [facility] provider you are looking for under [facility] and page to a number that you can be directly reached. If you still have difficulty reaching the provider, please page the [facility] (Director on Call) for the Hospitalists listed on amion for assistance.  12/25/2018, 6:04 PM

## 2018-12-25 NOTE — ED Notes (Signed)
Patient transported to CT 

## 2018-12-25 NOTE — Plan of Care (Signed)
POC reviewed with patient and family

## 2018-12-25 NOTE — Progress Notes (Signed)
MEWS/VS Documentation      12/25/2018 1725 12/25/2018 1835       MEWS Score:  3  4     MEWS Score Color:  Yellow  Red     Resp:  (!) 31  (!) 33     Pulse:  (!) 111  (!) 112     BP:  110/63  (!) 109/51     Temp:  98.5 F (36.9 C)  -     O2 Device:  Nasal Cannula  Nasal Cannula     O2 Flow Rate (L/min):  2 L/min  2 L/min       Increased VS per protocol to q2h and next VS due at 1925 to be passed in shift report.  Notified charge nurse of initial MEWS score.  Patient stable on 2L/[place] with increasing tachypnea with exertion.

## 2018-12-26 ENCOUNTER — Inpatient Hospital Stay (HOSPITAL_COMMUNITY): Payer: HRSA Program

## 2018-12-26 DIAGNOSIS — R7989 Other specified abnormal findings of blood chemistry: Secondary | ICD-10-CM

## 2018-12-26 DIAGNOSIS — J9601 Acute respiratory failure with hypoxia: Secondary | ICD-10-CM

## 2018-12-26 DIAGNOSIS — U071 COVID-19: Secondary | ICD-10-CM

## 2018-12-26 LAB — CBC WITH DIFFERENTIAL/PLATELET
Abs Immature Granulocytes: 0.01 10*3/uL (ref 0.00–0.07)
Basophils Absolute: 0 10*3/uL (ref 0.0–0.1)
Basophils Relative: 0 %
Eosinophils Absolute: 0 10*3/uL (ref 0.0–0.5)
Eosinophils Relative: 0 %
HCT: 41.2 % (ref 36.0–46.0)
Hemoglobin: 13.3 g/dL (ref 12.0–15.0)
Immature Granulocytes: 0 %
Lymphocytes Relative: 34 %
Lymphs Abs: 0.8 10*3/uL (ref 0.7–4.0)
MCH: 28.5 pg (ref 26.0–34.0)
MCHC: 32.3 g/dL (ref 30.0–36.0)
MCV: 88.2 fL (ref 80.0–100.0)
Monocytes Absolute: 0.1 10*3/uL (ref 0.1–1.0)
Monocytes Relative: 4 %
Neutro Abs: 1.5 10*3/uL — ABNORMAL LOW (ref 1.7–7.7)
Neutrophils Relative %: 62 %
Platelets: 196 10*3/uL (ref 150–400)
RBC: 4.67 MIL/uL (ref 3.87–5.11)
RDW: 13.8 % (ref 11.5–15.5)
WBC: 2.5 10*3/uL — ABNORMAL LOW (ref 4.0–10.5)
nRBC: 0 % (ref 0.0–0.2)

## 2018-12-26 LAB — COMPREHENSIVE METABOLIC PANEL
ALT: 88 U/L — ABNORMAL HIGH (ref 0–44)
AST: 63 U/L — ABNORMAL HIGH (ref 15–41)
Albumin: 3.7 g/dL (ref 3.5–5.0)
Alkaline Phosphatase: 68 U/L (ref 38–126)
Anion gap: 8 (ref 5–15)
BUN: 7 mg/dL (ref 6–20)
CO2: 22 mmol/L (ref 22–32)
Calcium: 8.4 mg/dL — ABNORMAL LOW (ref 8.9–10.3)
Chloride: 110 mmol/L (ref 98–111)
Creatinine, Ser: 0.55 mg/dL (ref 0.44–1.00)
GFR calc Af Amer: >60 mL/min (ref >60)
GFR calc non Af Amer: >60 mL/min (ref >60)
Glucose, Bld: 159 mg/dL — ABNORMAL HIGH (ref 70–99)
Potassium: 3.7 mmol/L (ref 3.5–5.1)
Sodium: 140 mmol/L (ref 135–145)
Total Bilirubin: 0.4 mg/dL (ref 0.3–1.2)
Total Protein: 7.2 g/dL (ref 6.5–8.1)

## 2018-12-26 LAB — HIV ANTIBODY (ROUTINE TESTING W REFLEX): HIV Screen 4th Generation wRfx: NONREACTIVE

## 2018-12-26 LAB — D-DIMER, QUANTITATIVE: D-Dimer, Quant: 0.64 ug/mL-FEU — ABNORMAL HIGH (ref 0.00–0.50)

## 2018-12-26 LAB — ABO/RH: ABO/RH(D): O POS

## 2018-12-26 LAB — C-REACTIVE PROTEIN: CRP: 3.4 mg/dL — ABNORMAL HIGH (ref <1.0)

## 2018-12-26 LAB — FERRITIN: Ferritin: 141 ng/mL (ref 11–307)

## 2018-12-26 NOTE — Progress Notes (Signed)
Contacted Dr. Sloan Leiter regarding increase in oxygen need from 2L to 4L.  Encouraged patient to prone.  No new orders received.  Will continue to monitor.

## 2018-12-26 NOTE — Progress Notes (Signed)
Called and spoke with patient's sister, Nelva Bush.  She was updated on patient's condition during the part of the shift.  All questions answered.  Earleen Reaper RN

## 2018-12-26 NOTE — Progress Notes (Signed)
   12/26/18 2058  MEWS Assessment  Is this an acute change? Yes  MEWS guidelines implemented *See Row Information* Red  Provider Notification  Provider Name/Title Mitzi Hansen MD  Date Provider Notified 12/26/18  Time Provider Notified 2058  Notification Type Page  Notification Reason Change in status   Dr. Myna Hidalgo made aware.  Patient given Clonopin PO, Albuterol treatment, and Tussionex.  Last MEWS score now 2.  Will continue to monitor patient.  Earleen Reaper RN

## 2018-12-26 NOTE — Progress Notes (Signed)
Bilateral lower extremity venous duplex has been completed. Preliminary results can be found in CV Proc through chart review.   12/26/18 10:09 AM Carlos Levering RVT

## 2018-12-26 NOTE — Progress Notes (Signed)
PROGRESS NOTE                                                                                                                                                                                                             Patient Demographics:    Nancy Weeks, is a 23 y.o. female, DOB - [birth year], ZOX:[id number]  Outpatient Primary MD for the patient is System, Pcp Not In   Admit date - 12/25/2018   LOS - 1  No chief complaint on file.      Brief Narrative: Patient is a 23 y.o. female with PMHx of morbid obesity-presented to the hospital with shortness of breath, cough, fever-found to have acute hypoxemic respiratory failure in the setting of COVID-19 pneumonia.  See below for further details.   Subjective:    Nancy Weeks today feels much better-on just 2 L of oxygen.  She appears much more comfortable than yesterday.   Assessment  & Plan :   Acute Hypoxic Resp Failure due to Covid 19 Viral pneumonia: Improved-continue steroids and Remdesivir.  Continue to follow inflammatory markers.  CT chest/lower extremity Dopplers negative for VTE.  Fever: afebrile  O2 requirements: On 2 l/m (was on 2L/m yesterday)  COVID-19 Labs: Recent Labs    12/25/18 1459 12/25/18 1507 12/26/18 0114  DDIMER  --   --  0.64*  FERRITIN 118  --  141  CRP  --  2.7* 3.4*    Lab Results  Component Value Date   SARSCOV2NAA DETECTED (A) 12/23/2018     COVID-19 Medications: Steroids:8/27>> Remdesivir:8/27>> Actemra:not given Convalescent Plasma:N/A Research Studies:N/A  Other medications: Diuretics:Euvolemic-no need for lasix Antibiotics:Not needed as no evidence of bacterial infection  Prone/Incentive Spirometry: encouraged patient to lie prone for 3-4 hours at a time for a total of 16 hours a day, and to encourage incentive spirometry use 3-4/hour.  DVT Prophylaxis  :  Lovenox  SIRS: Secondary to  COVID-19-resolved-all cultures negative so far.  Transaminitis: Appears mild-likely secondary COVID-19-continue to follow  Morbid Obesity: Estimated body mass index is 51.21 kg/m as calculated from the following:   Height as of this encounter: 5\' 2"  (1.575 m).   Weight as of this encounter: 127 kg.   ABG: No results found for: PHART, PCO2ART, PO2ART, HCO3, TCO2, ACIDBASEDEF, O2SAT  Vent Settings:    Condition -Stable  Family Communication  : Sister updated over the phone  Code Status :  Full Code  Diet :  Diet Order            Diet regular Room service appropriate? Yes; Fluid consistency: Thin  Diet effective now               Disposition Plan  :  Remain hospitalized  Consults  :  None  Procedures  :  None  Antibiotics  :    Anti-infectives (From admission, onward)   Start     Dose/Rate Route Frequency Ordered Stop   12/26/18 2000  remdesivir 100 mg in sodium chloride 0.9 % 250 mL IVPB     100 mg 500 mL/hr over 30 Minutes Intravenous Every 24 hours 12/25/18 1844 12/30/18 1959   12/25/18 2000  remdesivir 200 mg in sodium chloride 0.9 % 250 mL IVPB     200 mg 500 mL/hr over 30 Minutes Intravenous Once 12/25/18 1844 12/26/18 0526      Inpatient Medications  Scheduled Meds:  enoxaparin (LOVENOX) injection  60 mg Subcutaneous Q24H   methylPREDNISolone (SOLU-MEDROL) injection  60 mg Intravenous Q12H   sodium chloride flush  3 mL Intravenous Q12H   vitamin C  500 mg Oral Daily   zinc sulfate  220 mg Oral Daily   Continuous Infusions:  remdesivir 100 mg in NS 250 mL     PRN Meds:.acetaminophen, albuterol, chlorpheniramine-HYDROcodone, clonazePAM, guaiFENesin-dextromethorphan, ondansetron **OR** ondansetron (ZOFRAN) IV, polyethylene glycol, traMADol   Time Spent in minutes  25  See all Orders from today for further details   Jeoffrey MassedShanker Katriana Dortch M.D on 12/26/2018 at 2:05 PM  To page go to [email or web address] - use universal password  [facility] -   Office  [phone]    Objective:   Vitals:   12/25/18 2326 12/25/18 2329 12/26/18 0505 12/26/18 0745  BP: (!) 103/57  102/65 (!) 100/42  Pulse:   (!) 103 (!) 102  Resp: (!) 40  (!) 29 (!) 32  Temp:  99.1 F (37.3 C) 98.5 F (36.9 C) 98.4 F (36.9 C)  TempSrc:  Oral Oral Oral  SpO2:  91%  92%  Weight:      Height:        Wt Readings from Last 3 Encounters:  12/25/18 127 kg  12/25/18 127 kg  12/23/18 127 kg     Intake/Output Summary (Last 24 hours) at 12/26/2018 1405 Last data filed at 12/25/2018 1805 Gross per 24 hour  Intake 0 ml  Output --  Net 0 ml     Physical Exam Gen Exam:Alert awake-not in any distress HEENT:atraumatic, normocephalic Chest: B/L clear to auscultation anteriorly CVS:S1S2 regular Abdomen:soft non tender, non distended Extremities:no edema Neurology: Non focal Skin: no rash   Data Review:    CBC Recent Labs  Lab 12/23/18 1306 12/25/18 1256 12/26/18 0114  WBC 2.7* 3.3* 2.5*  HGB 14.9 14.0 13.3  HCT 45.2 42.4 41.2  PLT 163 172 196  MCV 87.4 86.0 88.2  MCH 28.8 28.4 28.5  MCHC 33.0 33.0 32.3  RDW 13.4 13.7 13.8  LYMPHSABS 1.0 1.1 0.8  MONOABS 0.2 0.3 0.1  EOSABS 0.0 0.0 0.0  BASOSABS 0.0 0.0 0.0    Chemistries  Recent Labs  Lab 12/23/18 1306 12/25/18 1256 12/26/18 0114  NA 138 139 140  K 3.5 3.5 3.7  CL 107 105 110  CO2 24 24 22   GLUCOSE 127* 119* 159*  BUN 6 6 7   CREATININE 0.55 0.55  0.55  CALCIUM 8.5* 8.4* 8.4*  AST  --  44* 63*  ALT  --  70* 88*  ALKPHOS  --  63 68  BILITOT  --  0.5 0.4   ------------------------------------------------------------------------------------------------------------------ No results for input(s): CHOL, HDL, LDLCALC, TRIG, CHOLHDL, LDLDIRECT in the last 72 hours.  No results found for: HGBA1C ------------------------------------------------------------------------------------------------------------------ No results for input(s): TSH, T4TOTAL, T3FREE, THYROIDAB in the last 72  hours.  Invalid input(s): FREET3 ------------------------------------------------------------------------------------------------------------------ Recent Labs    12/25/18 1459 12/26/18 0114  FERRITIN 118 141    Coagulation profile No results for input(s): INR, PROTIME in the last 168 hours.  Recent Labs    12/26/18 0114  DDIMER 0.64*    Cardiac Enzymes No results for input(s): CKMB, TROPONINI, MYOGLOBIN in the last 168 hours.  Invalid input(s): CK ------------------------------------------------------------------------------------------------------------------ No results found for: BNP  Micro Results Recent Results (from the past 240 hour(s))  Novel Coronavirus, NAA (Hosp order, Send-out to Ref Lab; TAT 18-24 hrs     Status: Abnormal   Collection Time: 12/23/18  2:57 PM   Specimen: Nasopharyngeal Swab; Respiratory  Result Value Ref Range Status   SARS-CoV-2, NAA DETECTED (A) NOT DETECTED Final    Comment: CRITICAL RESULT CALLED TO, READ BACK BY AND VERIFIED WITH: TIFFANY JOHNSON 12/24/2018 1300 HKP (NOTE)                  Client Requested Flag This test was developed and its performance characteristics determined by [employer]. This test has not been FDA cleared or approved. This test has been authorized by FDA under an Emergency Use Authorization (EUA). This test is only authorized for the duration of time the declaration that circumstances exist justifying the authorization of the emergency use of in vitro diagnostic tests for detection of SARS-CoV-2 virus and/or diagnosis of COVID-19 infection under section 564(b)(1) of the Act, 21 U.S.C. 161WRU-0(A)(5), unless the authorization is terminated or revoked sooner. When diagnostic testing is negative, the possibility of a false negative result should be considered in the context of a patient's recent exposures and the presence of clinical signs and symptoms consistent with COVID-19. An individual without  symptoms  of COVID-19 and who is not shedding SARS-CoV-2 virus would expect to have a negative (not detected) result in this assay. Performed At: [facility] [address] [place], [place] [id number] Jolene Schimke MD NW:[id number]    Coronavirus Source NASOPHARYNGEAL  Final    Comment: Performed at [facility], [address] [place], [place] [address]  Blood culture (routine x 2)     Status: None (Preliminary result)   Collection Time: 12/25/18  3:00 PM   Specimen: BLOOD  Result Value Ref Range Status   Specimen Description BLOOD RIGHT WIST  Final   Special Requests   Final    BOTTLES DRAWN AEROBIC AND ANAEROBIC Blood Culture adequate volume   Culture   Final    NO GROWTH < 24 HOURS Performed at [facility], [address], [place], [place] [address]    Report Status PENDING  Incomplete  Blood culture (routine x 2)     Status: None (Preliminary result)   Collection Time: 12/25/18  3:00 PM   Specimen: BLOOD  Result Value Ref Range Status   Specimen Description BLOOD LEFT ANTECUBITAL  Final   Special Requests   Final    BOTTLES DRAWN AEROBIC AND ANAEROBIC Blood Culture results may not be optimal due to an excessive volume of blood received in culture bottles  Culture   Final    NO GROWTH < 24 HOURS Performed at [facility], [address] [place], [place] [address]    Report Status PENDING  Incomplete    Radiology Reports Dg Chest 1 View  Result Date: 12/23/2018 CLINICAL DATA:  Shortness of breath, fevers for 3 days, COVID-19 positive contact EXAM: CHEST  1 VIEW COMPARISON:  None. FINDINGS: There is elevation the right hemidiaphragm with adjacent hazy opacity likely reflective of atelectasis though can not exclude underlying consolidation. The pulmonary vascularity is normal. No pneumothorax. No visible effusion. Cardiomediastinal contours are unremarkable for portable seated technique. No acute osseous or soft tissue abnormality  IMPRESSION: Elevation of the right hemidiaphragm with adjacent hazy opacity likely reflective of atelectasis though can not exclude underlying consolidation/infection. Electronically Signed   By: Kreg Shropshire M.D.   On: 12/23/2018 14:22   Ct Angio Chest Pe W And/or Wo Contrast  Result Date: 12/25/2018 CLINICAL DATA:  Pt presents to ED via POV with c/o fever, SOB, and cough. Pt with noted SOB in triage, and febrile, pt states tested 2 days when she was here, however was not told her results, with +Covid test on 8/25. 80ml of OMni 350 used.^90mL OMNIPAQUE IOHEXOL 350 MG/ML SOLN EXAM: CT ANGIOGRAPHY CHEST WITH CONTRAST TECHNIQUE: Multidetector CT imaging of the chest was performed using the standard protocol during bolus administration of intravenous contrast. Multiplanar CT image reconstructions and MIPs were obtained to evaluate the vascular anatomy. CONTRAST:  18mL OMNIPAQUE IOHEXOL 350 MG/ML SOLN COMPARISON:  Same day chest radiograph FINDINGS: Cardiovascular: Suboptimal opacification of the pulmonary arteries limiting evaluation beyond the central vessels. There is no large central pulmonary embolus. Mediastinum/Nodes: No enlarged mediastinal, hilar, or axillary lymph nodes. Thyroid gland, trachea, and esophagus demonstrate no significant findings. Lungs/Pleura: There is ground-glass opacity throughout the right apically and posterior lung as well as in the left lower lobe. There are consolidative opacities in the right lung base as well as smaller area in the posterior right apex and posterior left lower lobe suspicious for multifocal pneumonia. No pneumothorax or pleural effusion. Upper Abdomen: Fatty infiltration of the liver. Musculoskeletal: No chest wall abnormality. No acute or significant osseous findings. Review of the MIP images confirms the above findings. IMPRESSION: 1. Suboptimal opacification of the pulmonary arteries significantly limiting evaluation beyond the central vessels. No large central  pulmonary embolus. 2. Ground-glass opacity throughout much of the right lung with multifocal consolidation predominantly in the right lower lobe but also smaller areas in the right lung apex and posterior left lower lobe suspicious for multifocal pneumonia. 3. Hepatic steatosis. Electronically Signed   By: Emmaline Kluver M.D.   On: 12/25/2018 15:49   Dg Chest Portable 1 View  Result Date: 12/25/2018 CLINICAL DATA:  Cough, shortness of breath, evaluate for pneumonia, positive COVID-19 EXAM: PORTABLE CHEST 1 VIEW COMPARISON:  12/23/2018 FINDINGS: Low volume AP portable examination, not significantly changed compared to prior examination. No acute appearing airspace opacity. IMPRESSION: Low volume AP portable examination, not significantly changed compared to prior examination. No acute appearing airspace opacity. Electronically Signed   By: Lauralyn Primes M.D.   On: 12/25/2018 12:55   Vas [place] Lower Extremity Venous (dvt)  Result Date: 12/26/2018  Lower Venous Study Indications: Elevated Ddimer.  Risk Factors: COVID 19 positive. Limitations: Body habitus and poor ultrasound/tissue interface. Comparison Study: No prior studies. Performing Technologist: Chanda Busing RVT  Examination Guidelines: A complete evaluation includes B-mode imaging, spectral Doppler, color Doppler, and power Doppler as needed  of all accessible portions of each vessel. Bilateral testing is considered an integral part of a complete examination. Limited examinations for reoccurring indications may be performed as noted.  +---------+---------------+---------+-----------+----------+--------------+  RIGHT     Compressibility Phasicity Spontaneity Properties Thrombus Aging  +---------+---------------+---------+-----------+----------+--------------+  CFV       Full            Yes       Yes                                    +---------+---------------+---------+-----------+----------+--------------+  SFJ       Full                                                              +---------+---------------+---------+-----------+----------+--------------+  FV Prox   Full                                                             +---------+---------------+---------+-----------+----------+--------------+  FV Mid    Full                                                             +---------+---------------+---------+-----------+----------+--------------+  FV Distal Full                                                             +---------+---------------+---------+-----------+----------+--------------+  PFV       Full                                                             +---------+---------------+---------+-----------+----------+--------------+  POP       Full            Yes       Yes                                    +---------+---------------+---------+-----------+----------+--------------+  PTV       Full                                                             +---------+---------------+---------+-----------+----------+--------------+  PERO      Full                                                             +---------+---------------+---------+-----------+----------+--------------+   +---------+---------------+---------+-----------+----------+--------------+  LEFT      Compressibility Phasicity Spontaneity Properties Thrombus Aging  +---------+---------------+---------+-----------+----------+--------------+  CFV       Full            Yes       Yes                                    +---------+---------------+---------+-----------+----------+--------------+  SFJ       Full                                                             +---------+---------------+---------+-----------+----------+--------------+  FV Prox   Full                                                             +---------+---------------+---------+-----------+----------+--------------+  FV Mid    Full                                                              +---------+---------------+---------+-----------+----------+--------------+  FV Distal Full                                                             +---------+---------------+---------+-----------+----------+--------------+  PFV       Full                                                             +---------+---------------+---------+-----------+----------+--------------+  POP       Full            Yes       Yes                                    +---------+---------------+---------+-----------+----------+--------------+  PTV       Full                                                             +---------+---------------+---------+-----------+----------+--------------+  PERO      Full                                                             +---------+---------------+---------+-----------+----------+--------------+  Summary: Right: There is no evidence of deep vein thrombosis in the lower extremity. However, portions of this examination were limited- see technologist comments above. No cystic structure found in the popliteal fossa. Left: There is no evidence of deep vein thrombosis in the lower extremity. However, portions of this examination were limited- see technologist comments above. No cystic structure found in the popliteal fossa.  *See table(s) above for measurements and observations.    Preliminary

## 2018-12-27 ENCOUNTER — Inpatient Hospital Stay (HOSPITAL_COMMUNITY): Payer: HRSA Program

## 2018-12-27 LAB — COMPREHENSIVE METABOLIC PANEL
ALT: 122 U/L — ABNORMAL HIGH (ref 0–44)
AST: 67 U/L — ABNORMAL HIGH (ref 15–41)
Albumin: 3.5 g/dL (ref 3.5–5.0)
Alkaline Phosphatase: 59 U/L (ref 38–126)
Anion gap: 10 (ref 5–15)
BUN: 14 mg/dL (ref 6–20)
CO2: 23 mmol/L (ref 22–32)
Calcium: 8.3 mg/dL — ABNORMAL LOW (ref 8.9–10.3)
Chloride: 109 mmol/L (ref 98–111)
Creatinine, Ser: 0.55 mg/dL (ref 0.44–1.00)
GFR calc Af Amer: >60 mL/min (ref >60)
GFR calc non Af Amer: >60 mL/min (ref >60)
Glucose, Bld: 159 mg/dL — ABNORMAL HIGH (ref 70–99)
Potassium: 4 mmol/L (ref 3.5–5.1)
Sodium: 142 mmol/L (ref 135–145)
Total Bilirubin: 0.2 mg/dL — ABNORMAL LOW (ref 0.3–1.2)
Total Protein: 6.8 g/dL (ref 6.5–8.1)

## 2018-12-27 LAB — CBC WITH DIFFERENTIAL/PLATELET
Abs Immature Granulocytes: 0.03 10*3/uL (ref 0.00–0.07)
Basophils Absolute: 0 10*3/uL (ref 0.0–0.1)
Basophils Relative: 0 %
Eosinophils Absolute: 0 10*3/uL (ref 0.0–0.5)
Eosinophils Relative: 0 %
HCT: 39.3 % (ref 36.0–46.0)
Hemoglobin: 12.7 g/dL (ref 12.0–15.0)
Immature Granulocytes: 1 %
Lymphocytes Relative: 19 %
Lymphs Abs: 1.2 10*3/uL (ref 0.7–4.0)
MCH: 28.7 pg (ref 26.0–34.0)
MCHC: 32.3 g/dL (ref 30.0–36.0)
MCV: 88.9 fL (ref 80.0–100.0)
Monocytes Absolute: 0.4 10*3/uL (ref 0.1–1.0)
Monocytes Relative: 6 %
Neutro Abs: 4.7 10*3/uL (ref 1.7–7.7)
Neutrophils Relative %: 74 %
Platelets: 216 10*3/uL (ref 150–400)
RBC: 4.42 MIL/uL (ref 3.87–5.11)
RDW: 14.1 % (ref 11.5–15.5)
WBC: 6.3 10*3/uL (ref 4.0–10.5)
nRBC: 0 % (ref 0.0–0.2)

## 2018-12-27 LAB — D-DIMER, QUANTITATIVE: D-Dimer, Quant: 0.57 ug/mL-FEU — ABNORMAL HIGH (ref 0.00–0.50)

## 2018-12-27 LAB — FERRITIN: Ferritin: 97 ng/mL (ref 11–307)

## 2018-12-27 LAB — C-REACTIVE PROTEIN: CRP: 1.1 mg/dL — ABNORMAL HIGH (ref <1.0)

## 2018-12-27 NOTE — Progress Notes (Signed)
Attempted to call patient's sister, Nelva Bush, x 2.  Line busy.  Patient has been communicating with family today via text.  Earleen Reaper RN

## 2018-12-27 NOTE — Progress Notes (Addendum)
PROGRESS NOTE                                                                                                                                                                                                             Patient Demographics:    Nancy Weeks, is a 23 y.o. female, DOB - [birth year], MVH:[id number]  Outpatient Primary MD for the patient is System, Pcp Not In   Admit date - 12/25/2018   LOS - 2  No chief complaint on file.      Brief Narrative: Patient is a 23 y.o. female with PMHx of morbid obesity-presented to the hospital with shortness of breath, cough, fever-found to have acute hypoxemic respiratory failure in the setting of COVID-19 pneumonia.  See below for further details.   Subjective:    Nancy Weeks today continues to feel short of breath when she ambulates and moves around.    She was on 5 L of oxygen via nasal cannula-with O2 saturations in the high 80s this morning-as her clinical state did not match-we changed her oxygen probe to her ear-she is now in the high 90s on just 2-3 L.     Assessment  & Plan :   Acute Hypoxic Resp Failure due to Covid 19 Viral pneumonia: Appears essentially the same-on 2-3 L of oxygen.  Inflammatory markers are downtrending.  Continue Remdesivir and steroids.  CTA chest/lower extremity Dopplers-negative for VTE.  Fever: afebrile  O2 requirements: On 2-3 l/m (was on 2L/m yesterday)  COVID-19 Labs: Recent Labs    12/25/18 1459 12/25/18 1507 12/26/18 0114 12/27/18 0127  DDIMER  --   --  0.64* 0.57*  FERRITIN 118  --  141 97  CRP  --  2.7* 3.4* 1.1*    Lab Results  Component Value Date   SARSCOV2NAA DETECTED (A) 12/23/2018     COVID-19 Medications: Steroids:8/27>> Remdesivir:8/27>> Actemra:not given Convalescent Plasma:N/A Research Studies:N/A  Other medications: Diuretics:Euvolemic-no need for lasix Antibiotics:Not needed as no  evidence of bacterial infection  Prone/Incentive Spirometry: encouraged patient to lie prone for 3-4 hours at a time for a total of 16 hours a day, and to encourage incentive spirometry use 3-4/hour.  DVT Prophylaxis  :  Lovenox  SIRS: Secondary to COVID-19-resolved-all cultures negative so far.  Transaminitis: Appears mild-but ALT has increased compared to yesterday-likely secondary COVID-19-continue  to follow  Morbid Obesity: Estimated body mass index is 51.21 kg/m as calculated from the following:   Height as of this encounter: 5\' 2"  (1.575 m).   Weight as of this encounter: 127 kg.   ABG: No results found for: PHART, PCO2ART, PO2ART, HCO3, TCO2, ACIDBASEDEF, O2SAT  Vent Settings:    Condition -Stable  Family Communication  : Sister updated over the phone on 8/28  Code Status :  Full Code  Diet :  Diet Order            Diet regular Room service appropriate? Yes; Fluid consistency: Thin  Diet effective now               Disposition Plan  :  Remain hospitalized  Consults  :  None  Procedures  :  None  Antibiotics  :    Anti-infectives (From admission, onward)   Start     Dose/Rate Route Frequency Ordered Stop   12/26/18 2000  remdesivir 100 mg in sodium chloride 0.9 % 250 mL IVPB     100 mg 500 mL/hr over 30 Minutes Intravenous Every 24 hours 12/25/18 1844 12/30/18 1959   12/25/18 2000  remdesivir 200 mg in sodium chloride 0.9 % 250 mL IVPB     200 mg 500 mL/hr over 30 Minutes Intravenous Once 12/25/18 1844 12/26/18 0526      Inpatient Medications  Scheduled Meds:  enoxaparin (LOVENOX) injection  60 mg Subcutaneous Q24H   methylPREDNISolone (SOLU-MEDROL) injection  60 mg Intravenous Q12H   sodium chloride flush  3 mL Intravenous Q12H   vitamin C  500 mg Oral Daily   zinc sulfate  220 mg Oral Daily   Continuous Infusions:  remdesivir 100 mg in NS 250 mL 100 mg (12/26/18 2115)   PRN Meds:.acetaminophen, albuterol, chlorpheniramine-HYDROcodone,  clonazePAM, guaiFENesin-dextromethorphan, ondansetron **OR** ondansetron (ZOFRAN) IV, polyethylene glycol, traMADol   Time Spent in minutes  25  See all Orders from today for further details   Jeoffrey Massed M.D on 12/27/2018 at 1:40 PM  To page go to [email or web address] - use universal password  [facility] -  Office  [phone]    Objective:   Vitals:   12/27/18 1133 12/27/18 1145 12/27/18 1200 12/27/18 1313  BP:    122/76  Pulse: (!) 103 99 (!) 103 (!) 113  Resp: (!) 26 (!) 29 (!) 27 (!) 32  Temp:    98.2 F (36.8 C)  TempSrc:    Oral  SpO2: 99% 98% 97% 95%  Weight:      Height:        Wt Readings from Last 3 Encounters:  12/25/18 127 kg  12/25/18 127 kg  12/23/18 127 kg     Intake/Output Summary (Last 24 hours) at 12/27/2018 1340 Last data filed at 12/27/2018 0600 Gross per 24 hour  Intake 1450.03 ml  Output 0 ml  Net 1450.03 ml     Physical Exam Gen Exam:Alert awake-not in any distress HEENT:atraumatic, normocephalic Chest: B/L clear to auscultation anteriorly CVS:S1S2 regular Abdomen:soft non tender, non distended Extremities:no edema Neurology: Non focal Skin: no rash   Data Review:    CBC Recent Labs  Lab 12/23/18 1306 12/25/18 1256 12/26/18 0114 12/27/18 0127  WBC 2.7* 3.3* 2.5* 6.3  HGB 14.9 14.0 13.3 12.7  HCT 45.2 42.4 41.2 39.3  PLT 163 172 196 216  MCV 87.4 86.0 88.2 88.9  MCH 28.8 28.4 28.5 28.7  MCHC 33.0 33.0 32.3 32.3  RDW 13.4 13.7 13.8 14.1  LYMPHSABS 1.0 1.1 0.8 1.2  MONOABS 0.2 0.3 0.1 0.4  EOSABS 0.0 0.0 0.0 0.0  BASOSABS 0.0 0.0 0.0 0.0    Chemistries  Recent Labs  Lab 12/23/18 1306 12/25/18 1256 12/26/18 0114 12/27/18 0127  NA 138 139 140 142  K 3.5 3.5 3.7 4.0  CL 107 105 110 109  CO2 24 24 22 23   GLUCOSE 127* 119* 159* 159*  BUN 6 6 7 14   CREATININE 0.55 0.55 0.55 0.55  CALCIUM 8.5* 8.4* 8.4* 8.3*  AST  --  44* 63* 67*  ALT  --  70* 88* 122*  ALKPHOS  --  63 68 59  BILITOT  --  0.5 0.4 0.2*    ------------------------------------------------------------------------------------------------------------------ No results for input(s): CHOL, HDL, LDLCALC, TRIG, CHOLHDL, LDLDIRECT in the last 72 hours.  No results found for: HGBA1C ------------------------------------------------------------------------------------------------------------------ No results for input(s): TSH, T4TOTAL, T3FREE, THYROIDAB in the last 72 hours.  Invalid input(s): FREET3 ------------------------------------------------------------------------------------------------------------------ Recent Labs    12/26/18 0114 12/27/18 0127  FERRITIN 141 97    Coagulation profile No results for input(s): INR, PROTIME in the last 168 hours.  Recent Labs    12/26/18 0114 12/27/18 0127  DDIMER 0.64* 0.57*    Cardiac Enzymes No results for input(s): CKMB, TROPONINI, MYOGLOBIN in the last 168 hours.  Invalid input(s): CK ------------------------------------------------------------------------------------------------------------------ No results found for: BNP  Micro Results Recent Results (from the past 240 hour(s))  Novel Coronavirus, NAA (Hosp order, Send-out to Ref Lab; TAT 18-24 hrs     Status: Abnormal   Collection Time: 12/23/18  2:57 PM   Specimen: Nasopharyngeal Swab; Respiratory  Result Value Ref Range Status   SARS-CoV-2, NAA DETECTED (A) NOT DETECTED Final    Comment: CRITICAL RESULT CALLED TO, READ BACK BY AND VERIFIED WITH: TIFFANY JOHNSON 12/24/2018 1300 HKP (NOTE)                  Client Requested Flag This test was developed and its performance characteristics determined by [employer]. This test has not been FDA cleared or approved. This test has been authorized by FDA under an Emergency Use Authorization (EUA). This test is only authorized for the duration of time the declaration that circumstances exist justifying the authorization of the emergency use of in vitro diagnostic  tests for detection of SARS-CoV-2 virus and/or diagnosis of COVID-19 infection under section 564(b)(1) of the Act, 21 U.S.C. 657QIO-9(G)(2), unless the authorization is terminated or revoked sooner. When diagnostic testing is negative, the possibility of a false negative result should be considered in the context of a patient's recent exposures and the presence of clinical signs and symptoms consistent with COVID-19. An individual without symptoms  of COVID-19 and who is not shedding SARS-CoV-2 virus would expect to have a negative (not detected) result in this assay. Performed At: [facility] [address] [place], [place] [id number] Jolene Schimke MD MW:[id number]    Coronavirus Source NASOPHARYNGEAL  Final    Comment: Performed at [facility], [address] [place], [place] [address]  Blood culture (routine x 2)     Status: None (Preliminary result)   Collection Time: 12/25/18  3:00 PM   Specimen: BLOOD  Result Value Ref Range Status   Specimen Description BLOOD RIGHT WIST  Final   Special Requests   Final    BOTTLES DRAWN AEROBIC AND ANAEROBIC Blood Culture adequate volume   Culture   Final    NO GROWTH 2 DAYS Performed at [facility], [address] [place], [place] [address]    Report Status PENDING  Incomplete  Blood culture (routine x 2)     Status: None (Preliminary result)   Collection Time: 12/25/18  3:00 PM   Specimen: BLOOD  Result Value Ref Range Status   Specimen Description BLOOD LEFT ANTECUBITAL  Final   Special Requests   Final    BOTTLES DRAWN AEROBIC AND ANAEROBIC Blood Culture results may not be optimal due to an excessive volume of blood received in culture bottles   Culture   Final    NO GROWTH 2 DAYS Performed at [facility], [address] [place], [place] [address]    Report Status PENDING  Incomplete    Radiology Reports Dg Chest 1 View  Result Date: 12/23/2018 CLINICAL DATA:  Shortness of breath,  fevers for 3 days, COVID-19 positive contact EXAM: CHEST  1 VIEW COMPARISON:  None. FINDINGS: There is elevation the right hemidiaphragm with adjacent hazy opacity likely reflective of atelectasis though can not exclude underlying consolidation. The pulmonary vascularity is normal. No pneumothorax. No visible effusion. Cardiomediastinal contours are unremarkable for portable seated technique. No acute osseous or soft tissue abnormality IMPRESSION: Elevation of the right hemidiaphragm with adjacent hazy opacity likely reflective of atelectasis though can not exclude underlying consolidation/infection. Electronically Signed   By: Kreg ShropshirePrice  DeHay M.D.   On: 12/23/2018 14:22   Ct Angio Chest Pe W And/or Wo Contrast  Result Date: 12/25/2018 CLINICAL DATA:  Pt presents to ED via POV with c/o fever, SOB, and cough. Pt with noted SOB in triage, and febrile, pt states tested 2 days when she was here, however was not told her results, with +Covid test on 8/25. 75ml of OMni 350 used.^2775mL OMNIPAQUE IOHEXOL 350 MG/ML SOLN EXAM: CT ANGIOGRAPHY CHEST WITH CONTRAST TECHNIQUE: Multidetector CT imaging of the chest was performed using the standard protocol during bolus administration of intravenous contrast. Multiplanar CT image reconstructions and MIPs were obtained to evaluate the vascular anatomy. CONTRAST:  75mL OMNIPAQUE IOHEXOL 350 MG/ML SOLN COMPARISON:  Same day chest radiograph FINDINGS: Cardiovascular: Suboptimal opacification of the pulmonary arteries limiting evaluation beyond the central vessels. There is no large central pulmonary embolus. Mediastinum/Nodes: No enlarged mediastinal, hilar, or axillary lymph nodes. Thyroid gland, trachea, and esophagus demonstrate no significant findings. Lungs/Pleura: There is ground-glass opacity throughout the right apically and posterior lung as well as in the left lower lobe. There are consolidative opacities in the right lung base as well as smaller area in the posterior right  apex and posterior left lower lobe suspicious for multifocal pneumonia. No pneumothorax or pleural effusion. Upper Abdomen: Fatty infiltration of the liver. Musculoskeletal: No chest wall abnormality. No acute or significant osseous findings. Review of the MIP images confirms the above findings. IMPRESSION: 1. Suboptimal opacification of the pulmonary arteries significantly limiting evaluation beyond the central vessels. No large central pulmonary embolus. 2. Ground-glass opacity throughout much of the right lung with multifocal consolidation predominantly in the right lower lobe but also smaller areas in the right lung apex and posterior left lower lobe suspicious for multifocal pneumonia. 3. Hepatic steatosis. Electronically Signed   By: Emmaline KluverNancy  Ballantyne M.D.   On: 12/25/2018 15:49   Dg Chest Portable 1 View  Result Date: 12/25/2018 CLINICAL DATA:  Cough, shortness of breath, evaluate for pneumonia, positive COVID-19 EXAM: PORTABLE CHEST 1 VIEW COMPARISON:  12/23/2018 FINDINGS: Low volume AP portable examination, not significantly changed compared to prior examination. No acute appearing airspace opacity. IMPRESSION: Low volume AP  portable examination, not significantly changed compared to prior examination. No acute appearing airspace opacity. Electronically Signed   By: Eddie Candle M.D.   On: 12/25/2018 12:55   Dg Chest Port 1v Same Day  Result Date: 12/27/2018 CLINICAL DATA:  Shortness of breath. EXAM: PORTABLE CHEST 1 VIEW COMPARISON:  Chest x-ray and CT chest dated December 25, 2018. FINDINGS: The heart size and mediastinal contours are within normal limits. Normal pulmonary vascularity. Low lung volumes. Similar appearing patchy opacity in the right upper and lower lobes. No pleural effusion or pneumothorax. Unchanged elevation of the right hemidiaphragm. No acute osseous abnormality. IMPRESSION: 1. Similar appearing multifocal pneumonia in the right lung. Electronically Signed   By: Titus Dubin  M.D.   On: 12/27/2018 10:56   Vas [place] Lower Extremity Venous (dvt)  Result Date: 12/26/2018  Lower Venous Study Indications: Elevated Ddimer.  Risk Factors: COVID 19 positive. Limitations: Body habitus and poor ultrasound/tissue interface. Comparison Study: No prior studies. Performing Technologist: Oliver Hum RVT  Examination Guidelines: A complete evaluation includes B-mode imaging, spectral Doppler, color Doppler, and power Doppler as needed of all accessible portions of each vessel. Bilateral testing is considered an integral part of a complete examination. Limited examinations for reoccurring indications may be performed as noted.  +---------+---------------+---------+-----------+----------+--------------+  RIGHT     Compressibility Phasicity Spontaneity Properties Thrombus Aging  +---------+---------------+---------+-----------+----------+--------------+  CFV       Full            Yes       Yes                                    +---------+---------------+---------+-----------+----------+--------------+  SFJ       Full                                                             +---------+---------------+---------+-----------+----------+--------------+  FV Prox   Full                                                             +---------+---------------+---------+-----------+----------+--------------+  FV Mid    Full                                                             +---------+---------------+---------+-----------+----------+--------------+  FV Distal Full                                                             +---------+---------------+---------+-----------+----------+--------------+  PFV       Full                                                             +---------+---------------+---------+-----------+----------+--------------+  PFV       Full                                                             +---------+---------------+---------+-----------+----------+--------------+  POP       Full            Yes       Yes                                    +---------+---------------+---------+-----------+----------+--------------+  PTV        Full                                                             +---------+---------------+---------+-----------+----------+--------------+  PERO      Full                                                             +---------+---------------+---------+-----------+----------+--------------+   +---------+---------------+---------+-----------+----------+--------------+  LEFT      Compressibility Phasicity Spontaneity Properties Thrombus Aging  +---------+---------------+---------+-----------+----------+--------------+  CFV       Full            Yes       Yes                                    +---------+---------------+---------+-----------+----------+--------------+  SFJ       Full                                                             +---------+---------------+---------+-----------+----------+--------------+  FV Prox   Full                                                             +---------+---------------+---------+-----------+----------+--------------+  FV Mid    Full                                                             +---------+---------------+---------+-----------+----------+--------------+  FV Distal Full                                                             +---------+---------------+---------+-----------+----------+--------------+  PFV       Full                                                             +---------+---------------+---------+-----------+----------+--------------+  POP       Full            Yes       Yes                                    +---------+---------------+---------+-----------+----------+--------------+  PTV       Full                                                             +---------+---------------+---------+-----------+----------+--------------+  PERO      Full                                                             +---------+---------------+---------+-----------+----------+--------------+     Summary: Right: There is no evidence of deep  vein thrombosis in the lower extremity. However, portions of this examination were limited- see technologist comments above. No cystic structure found in the popliteal fossa. Left: There is no evidence of deep vein thrombosis in the lower extremity. However, portions of this examination were limited- see technologist comments above. No cystic structure found in the popliteal fossa.  *See table(s) above for measurements and observations. Electronically signed by Deitra Mayo MD on 12/26/2018 at 4:40:29 PM.    Final

## 2018-12-27 NOTE — Plan of Care (Signed)
Report obtained from [place], [place]. Care assumed for remainder of the shift.

## 2018-12-27 NOTE — Progress Notes (Signed)
Attempted to contact patient's sister, Nelva Bush for update, but was unable to reach her by phone x3. The patient said that she has been communicating with her family throughout the day via text.

## 2018-12-27 NOTE — Progress Notes (Signed)
MEWS score 4 in the red   12/27/18 1105  MEWS Score  Resp (!) 27 (counted manually)  ECG Heart Rate (!) 114  Pulse Rate (!) 113  BP 110/68  Temp 98 F (36.7 C)  SpO2 98 %  O2 Device Nasal Cannula  MEWS Score  MEWS RR 2  MEWS Pulse 2  MEWS Systolic 0  MEWS LOC 0  MEWS Temp 0  MEWS Score 4  MEWS Score Color Red  Patient had ambulated with assistance to the bathroom and became very anxious. Mews score now 3 and in the yellow. Provider notified via text page. This is not a new concern for the patient.

## 2018-12-28 DIAGNOSIS — R0602 Shortness of breath: Secondary | ICD-10-CM

## 2018-12-28 LAB — CBC WITH DIFFERENTIAL/PLATELET
Abs Immature Granulocytes: 0.18 10*3/uL — ABNORMAL HIGH (ref 0.00–0.07)
Basophils Absolute: 0 10*3/uL (ref 0.0–0.1)
Basophils Relative: 1 %
Eosinophils Absolute: 0 10*3/uL (ref 0.0–0.5)
Eosinophils Relative: 0 %
HCT: 40.6 % (ref 36.0–46.0)
Hemoglobin: 13.3 g/dL (ref 12.0–15.0)
Immature Granulocytes: 3 %
Lymphocytes Relative: 23 %
Lymphs Abs: 1.5 10*3/uL (ref 0.7–4.0)
MCH: 29.2 pg (ref 26.0–34.0)
MCHC: 32.8 g/dL (ref 30.0–36.0)
MCV: 89 fL (ref 80.0–100.0)
Monocytes Absolute: 0.5 10*3/uL (ref 0.1–1.0)
Monocytes Relative: 7 %
Neutro Abs: 4.4 10*3/uL (ref 1.7–7.7)
Neutrophils Relative %: 66 %
Platelets: 255 10*3/uL (ref 150–400)
RBC: 4.56 MIL/uL (ref 3.87–5.11)
RDW: 14 % (ref 11.5–15.5)
WBC: 6.6 10*3/uL (ref 4.0–10.5)
nRBC: 0 % (ref 0.0–0.2)

## 2018-12-28 LAB — COMPREHENSIVE METABOLIC PANEL
ALT: 129 U/L — ABNORMAL HIGH (ref 0–44)
AST: 52 U/L — ABNORMAL HIGH (ref 15–41)
Albumin: 3.5 g/dL (ref 3.5–5.0)
Alkaline Phosphatase: 58 U/L (ref 38–126)
Anion gap: 7 (ref 5–15)
BUN: 14 mg/dL (ref 6–20)
CO2: 24 mmol/L (ref 22–32)
Calcium: 8.3 mg/dL — ABNORMAL LOW (ref 8.9–10.3)
Chloride: 110 mmol/L (ref 98–111)
Creatinine, Ser: 0.55 mg/dL (ref 0.44–1.00)
GFR calc Af Amer: >60 mL/min (ref >60)
GFR calc non Af Amer: >60 mL/min (ref >60)
Glucose, Bld: 157 mg/dL — ABNORMAL HIGH (ref 70–99)
Potassium: 3.9 mmol/L (ref 3.5–5.1)
Sodium: 141 mmol/L (ref 135–145)
Total Bilirubin: 0.3 mg/dL (ref 0.3–1.2)
Total Protein: 6.8 g/dL (ref 6.5–8.1)

## 2018-12-28 LAB — D-DIMER, QUANTITATIVE: D-Dimer, Quant: 0.54 ug/mL-FEU — ABNORMAL HIGH (ref 0.00–0.50)

## 2018-12-28 LAB — FERRITIN: Ferritin: 105 ng/mL (ref 11–307)

## 2018-12-28 LAB — C-REACTIVE PROTEIN: CRP: <0.8 mg/dL (ref <1.0)

## 2018-12-28 MED ORDER — METHYLPREDNISOLONE SODIUM SUCC 40 MG IJ SOLR
40.0000 mg | Freq: Two times a day (BID) | INTRAMUSCULAR | Status: DC
  Administered 2018-12-29 – 2018-12-30 (×3): 40 mg via INTRAVENOUS
  Filled 2018-12-28 (×4): qty 1

## 2018-12-28 NOTE — Progress Notes (Signed)
PROGRESS NOTE                                                                                                                                                                                                             Patient Demographics:    Nancy Weeks, is a 23 y.o. female, DOB - 1995-06-01, WEX:[id number]  Outpatient Primary MD for the patient is System, Pcp Not In   Admit date - 12/25/2018   LOS - 3  No chief complaint on file.      Brief Narrative: Patient is a 23 y.o. female with PMHx of morbid obesity-presented to the hospital with shortness of breath, cough, fever-found to have acute hypoxemic respiratory failure in the setting of COVID-19 pneumonia.  See below for further details.   Subjective:    Nancy Weeks today continues to feel short of breath when she ambulates and moves around.    She was on 5 L of oxygen via nasal cannula-with O2 saturations in the high 80s this morning-as her clinical state did not match-we changed her oxygen probe to her ear-she is now in the high 90s on just 2-3 L.     Assessment  & Plan :   Acute Hypoxic Resp Failure due to Covid 19 Viral pneumonia: Appears essentially the same-on 2-3 L of oxygen.  Inflammatory markers are downtrending.  Continue Remdesivir and steroids.  CTA chest/lower extremity Dopplers-negative for VTE.  Advance activity, encouraged to sit up in chair and titrate down oxygen.     COVID-19 Labs: Recent Labs    12/26/18 0114 12/27/18 0127 12/28/18 0111  DDIMER 0.64* 0.57* 0.54*  FERRITIN 141 97 105  CRP 3.4* 1.1* <0.8    Lab Results  Component Value Date   SARSCOV2NAA DETECTED (A) 12/23/2018    Hepatic Function Latest Ref Rng & Units 12/28/2018 12/27/2018 12/26/2018  Total Protein 6.5 - 8.1 g/dL 6.8 6.8 7.2  Albumin 3.5 - 5.0 g/dL 3.5 3.5 3.7  AST 15 - 41 U/L 52(H) 67(H) 63(H)  ALT 0 - 44 U/L 129(H) 122(H) 88(H)  Alk Phosphatase  38 - 126 U/L 58 59 68  Total Bilirubin  0.3 - 1.2 mg/dL 0.3 0.2(L) 0.4     Transaminitis: Due to combination of COVID-19 infection and possibly Remdisvir, monitor trend, asymptomatic.  Morbid Obesity: BMI 51, follow with PCP for weight loss.   Condition -Stable  Family Communication  : Sister updated over the phone on 8/28  Code Status :  Full Code  Diet :  Diet Order            Diet regular Room service appropriate? Yes; Fluid consistency: Thin  Diet effective now               Disposition Plan  :  Remain hospitalized  Consults  :  None  Procedures  :  None  DVT Prophylaxis  :  Lovenox   Antibiotics  :    Anti-infectives (From admission, onward)   Start     Dose/Rate Route Frequency Ordered Stop   12/26/18 2000  remdesivir 100 mg in sodium chloride 0.9 % 250 mL IVPB     100 mg 500 mL/hr over 30 Minutes Intravenous Every 24 hours 12/25/18 1844 12/30/18 1959   12/25/18 2000  remdesivir 200 mg in sodium chloride 0.9 % 250 mL IVPB     200 mg 500 mL/hr over 30 Minutes Intravenous Once 12/25/18 1844 12/26/18 0526      Inpatient Medications  Scheduled Meds:  enoxaparin (LOVENOX) injection  60 mg Subcutaneous Q24H   methylPREDNISolone (SOLU-MEDROL) injection  60 mg Intravenous Q12H   sodium chloride flush  3 mL Intravenous Q12H   vitamin C  500 mg Oral Daily   zinc sulfate  220 mg Oral Daily   Continuous Infusions:  remdesivir 100 mg in NS 250 mL 100 mg (12/27/18 2124)   PRN Meds:.acetaminophen, albuterol, chlorpheniramine-HYDROcodone, clonazePAM, guaiFENesin-dextromethorphan, ondansetron **OR** ondansetron (ZOFRAN) IV, polyethylene glycol, traMADol   Time Spent in minutes  25  See all Orders from today for further details   Lala Lund M.D on 12/28/2018 at 9:42 AM  To page go to [email or web address] - use universal password  [facility] -  Office  [phone]    Objective:   Vitals:   12/28/18 0500 12/28/18 0510 12/28/18 0600 12/28/18  0742  BP:  107/65  112/77  Pulse: 84 91 86 80  Resp: (!) 26 (!) 26 (!) 25 20  Temp:  98.2 F (36.8 C)  98.6 F (37 C)  TempSrc:  Oral  Oral  SpO2: 94% 96% 97% 97%  Weight:      Height:        Wt Readings from Last 3 Encounters:  12/25/18 127 kg  12/25/18 127 kg  12/23/18 127 kg     Intake/Output Summary (Last 24 hours) at 12/28/2018 0942 Last data filed at 12/28/2018 0600 Gross per 24 hour  Intake 730.22 ml  Output 0 ml  Net 730.22 ml     Physical Exam  Awake Alert, Oriented X 3, No new F.N deficits, Normal affect [place].AT,PERRAL Supple Neck,No JVD, No cervical lymphadenopathy appriciated.  Symmetrical Chest wall movement, Good air movement bilaterally, CTAB RRR,No Gallops, Rubs or new Murmurs, No Parasternal Heave +ve B.Sounds, Abd Soft, No tenderness, No organomegaly appriciated, No rebound - guarding or rigidity. No Cyanosis, Clubbing or edema, No new Rash or bruise    Data Review:    CBC Recent Labs  Lab 12/23/18 1306 12/25/18 1256 12/26/18 0114 12/27/18 0127 12/28/18 0111  WBC 2.7* 3.3* 2.5* 6.3 6.6  HGB 14.9 14.0 13.3 12.7 13.3  HCT 45.2 42.4 41.2 39.3 40.6  PLT  163 172 196 216 255  MCV 87.4 86.0 88.2 88.9 89.0  MCH 28.8 28.4 28.5 28.7 29.2  MCHC 33.0 33.0 32.3 32.3 32.8  RDW 13.4 13.7 13.8 14.1 14.0  LYMPHSABS 1.0 1.1 0.8 1.2 1.5  MONOABS 0.2 0.3 0.1 0.4 0.5  EOSABS 0.0 0.0 0.0 0.0 0.0  BASOSABS 0.0 0.0 0.0 0.0 0.0    Chemistries  Recent Labs  Lab 12/23/18 1306 12/25/18 1256 12/26/18 0114 12/27/18 0127 12/28/18 0111  NA 138 139 140 142 141  K 3.5 3.5 3.7 4.0 3.9  CL 107 105 110 109 110  CO2 _0 GLUCOSE 127* 119* 159* 159* 157*  BUN _1 CREATININE 0.55 0.55 0.55 0.55 0.55  CALCIUM 8.5* 8.4* 8.4* 8.3* 8.3*  AST  --  44* 63* 67* 52*  ALT  --  70* 88* 122* 129*  ALKPHOS  --  63 68 59 58  BILITOT  --  0.5 0.4 0.2* 0.3    ------------------------------------------------------------------------------------------------------------------ No results for input(s): CHOL, HDL, LDLCALC, TRIG, CHOLHDL, LDLDIRECT in the last 72 hours.  No results found for: HGBA1C ------------------------------------------------------------------------------------------------------------------ No results for input(s): TSH, T4TOTAL, T3FREE, THYROIDAB in the last 72 hours.  Invalid input(s): FREET3 ------------------------------------------------------------------------------------------------------------------ Recent Labs    12/27/18 0127 12/28/18 0111  FERRITIN 97 105    Coagulation profile No results for input(s): INR, PROTIME in the last 168 hours.  Recent Labs    12/27/18 0127 12/28/18 0111  DDIMER 0.57* 0.54*    Cardiac Enzymes No results for input(s): CKMB, TROPONINI, MYOGLOBIN in the last 168 hours.  Invalid input(s): CK ------------------------------------------------------------------------------------------------------------------ No results found for: BNP  Micro Results Recent Results (from the past 240 hour(s))  Novel Coronavirus, NAA (Hosp order, Send-out to Ref Lab; TAT 18-24 hrs     Status: Abnormal   Collection Time: 12/23/18  2:57 PM   Specimen: Nasopharyngeal Swab; Respiratory  Result Value Ref Range Status   SARS-CoV-2, NAA DETECTED (A) NOT DETECTED Final    Comment: CRITICAL RESULT CALLED TO, READ BACK BY AND VERIFIED WITH: TIFFANY JOHNSON 12/24/2018 1300 HKP (NOTE)                  Client Requested Flag This test was developed and its performance characteristics determined by [employer]. This test has not been FDA cleared or approved. This test has been authorized by FDA under an Emergency Use Authorization (EUA). This test is only authorized for the duration of time the declaration that circumstances exist justifying the authorization of the emergency use of in vitro diagnostic  tests for detection of SARS-CoV-2 virus and/or diagnosis of COVID-19 infection under section 564(b)(1) of the Act, 21 U.S.C. 202RKY-7(C)(6), unless the authorization is terminated or revoked sooner. When diagnostic testing is negative, the possibility of a false negative result should be considered in the context of a patient's recent exposures and the presence of clinical signs and symptoms consistent with COVID-19. An individual without symptoms  of COVID-19 and who is not shedding SARS-CoV-2 virus would expect to have a negative (not detected) result in this assay. Performed At: [facility] [address] [place], [place] [id number] Rush Farmer MD VV:[id number]    [place]  Final    Comment: Performed at [facility], [address] [place], [place] [address]  Blood culture (routine x 2)     Status: None (Preliminary result)   Collection Time: 12/25/18  3:00 PM   Specimen: BLOOD  Result  Value Ref Range Status   Specimen Description BLOOD RIGHT WIST  Final   Special Requests   Final    BOTTLES DRAWN AEROBIC AND ANAEROBIC Blood Culture adequate volume   Culture   Final    NO GROWTH 3 DAYS Performed at [facility], [address] [place], [place] [address]    Report Status PENDING  Incomplete  Blood culture (routine x 2)     Status: None (Preliminary result)   Collection Time: 12/25/18  3:00 PM   Specimen: BLOOD  Result Value Ref Range Status   Specimen Description BLOOD LEFT ANTECUBITAL  Final   Special Requests   Final    BOTTLES DRAWN AEROBIC AND ANAEROBIC Blood Culture results may not be optimal due to an excessive volume of blood received in culture bottles   Culture   Final    NO GROWTH 3 DAYS Performed at [facility], [address] [place], [place] [address]    Report Status PENDING  Incomplete    Radiology Reports Dg Chest 1 View  Result Date: 12/23/2018 CLINICAL DATA:  Shortness of breath,  fevers for 3 days, COVID-19 positive contact EXAM: CHEST  1 VIEW COMPARISON:  None. FINDINGS: There is elevation the right hemidiaphragm with adjacent hazy opacity likely reflective of atelectasis though can not exclude underlying consolidation. The pulmonary vascularity is normal. No pneumothorax. No visible effusion. Cardiomediastinal contours are unremarkable for portable seated technique. No acute osseous or soft tissue abnormality IMPRESSION: Elevation of the right hemidiaphragm with adjacent hazy opacity likely reflective of atelectasis though can not exclude underlying consolidation/infection. Electronically Signed   By: Lovena Le M.D.   On: 12/23/2018 14:22   Ct Angio Chest Pe W And/or Wo Contrast  Result Date: 12/25/2018 CLINICAL DATA:  Pt presents to ED via POV with c/o fever, SOB, and cough. Pt with noted SOB in triage, and febrile, pt states tested 2 days when she was here, however was not told her results, with +Covid test on 8/25. 30m of OMni 350 used.^749mOMNIPAQUE IOHEXOL 350 MG/ML SOLN EXAM: CT ANGIOGRAPHY CHEST WITH CONTRAST TECHNIQUE: Multidetector CT imaging of the chest was performed using the standard protocol during bolus administration of intravenous contrast. Multiplanar CT image reconstructions and MIPs were obtained to evaluate the vascular anatomy. CONTRAST:  7527mMNIPAQUE IOHEXOL 350 MG/ML SOLN COMPARISON:  Same day chest radiograph FINDINGS: Cardiovascular: Suboptimal opacification of the pulmonary arteries limiting evaluation beyond the central vessels. There is no large central pulmonary embolus. Mediastinum/Nodes: No enlarged mediastinal, hilar, or axillary lymph nodes. Thyroid gland, trachea, and esophagus demonstrate no significant findings. Lungs/Pleura: There is ground-glass opacity throughout the right apically and posterior lung as well as in the left lower lobe. There are consolidative opacities in the right lung base as well as smaller area in the posterior right  apex and posterior left lower lobe suspicious for multifocal pneumonia. No pneumothorax or pleural effusion. Upper Abdomen: Fatty infiltration of the liver. Musculoskeletal: No chest wall abnormality. No acute or significant osseous findings. Review of the MIP images confirms the above findings. IMPRESSION: 1. Suboptimal opacification of the pulmonary arteries significantly limiting evaluation beyond the central vessels. No large central pulmonary embolus. 2. Ground-glass opacity throughout much of the right lung with multifocal consolidation predominantly in the right lower lobe but also smaller areas in the right lung apex and posterior left lower lobe suspicious for multifocal pneumonia. 3. Hepatic steatosis. Electronically Signed   By: NanAudie PintoD.   On: 12/25/2018 15:49  Dg Chest Portable 1 View  Result Date: 12/25/2018 CLINICAL DATA:  Cough, shortness of breath, evaluate for pneumonia, positive COVID-19 EXAM: PORTABLE CHEST 1 VIEW COMPARISON:  12/23/2018 FINDINGS: Low volume AP portable examination, not significantly changed compared to prior examination. No acute appearing airspace opacity. IMPRESSION: Low volume AP portable examination, not significantly changed compared to prior examination. No acute appearing airspace opacity. Electronically Signed   By: Eddie Candle M.D.   On: 12/25/2018 12:55   Dg Chest Port 1v Same Day  Result Date: 12/27/2018 CLINICAL DATA:  Shortness of breath. EXAM: PORTABLE CHEST 1 VIEW COMPARISON:  Chest x-ray and CT chest dated December 25, 2018. FINDINGS: The heart size and mediastinal contours are within normal limits. Normal pulmonary vascularity. Low lung volumes. Similar appearing patchy opacity in the right upper and lower lobes. No pleural effusion or pneumothorax. Unchanged elevation of the right hemidiaphragm. No acute osseous abnormality. IMPRESSION: 1. Similar appearing multifocal pneumonia in the right lung. Electronically Signed   By: Titus Dubin  M.D.   On: 12/27/2018 10:56   Vas [place] Lower Extremity Venous (dvt)  Result Date: 12/26/2018  Lower Venous Study Indications: Elevated Ddimer.  Risk Factors: COVID 19 positive. Limitations: Body habitus and poor ultrasound/tissue interface. Comparison Study: No prior studies. Performing Technologist: Oliver Hum RVT  Examination Guidelines: A complete evaluation includes B-mode imaging, spectral Doppler, color Doppler, and power Doppler as needed of all accessible portions of each vessel. Bilateral testing is considered an integral part of a complete examination. Limited examinations for reoccurring indications may be performed as noted.  +---------+---------------+---------+-----------+----------+--------------+  RIGHT     Compressibility Phasicity Spontaneity Properties Thrombus Aging  +---------+---------------+---------+-----------+----------+--------------+  CFV       Full            Yes       Yes                                    +---------+---------------+---------+-----------+----------+--------------+  SFJ       Full                                                             +---------+---------------+---------+-----------+----------+--------------+  FV Prox   Full                                                             +---------+---------------+---------+-----------+----------+--------------+  FV Mid    Full                                                             +---------+---------------+---------+-----------+----------+--------------+  FV Distal Full                                                             +---------+---------------+---------+-----------+----------+--------------+  PFV       Full                                                             +---------+---------------+---------+-----------+----------+--------------+  POP       Full            Yes       Yes                                    +---------+---------------+---------+-----------+----------+--------------+  PTV        Full                                                             +---------+---------------+---------+-----------+----------+--------------+  PERO      Full                                                             +---------+---------------+---------+-----------+----------+--------------+   +---------+---------------+---------+-----------+----------+--------------+  LEFT      Compressibility Phasicity Spontaneity Properties Thrombus Aging  +---------+---------------+---------+-----------+----------+--------------+  CFV       Full            Yes       Yes                                    +---------+---------------+---------+-----------+----------+--------------+  SFJ       Full                                                             +---------+---------------+---------+-----------+----------+--------------+  FV Prox   Full                                                             +---------+---------------+---------+-----------+----------+--------------+  FV Mid    Full                                                             +---------+---------------+---------+-----------+----------+--------------+  FV Distal Full                                                             +---------+---------------+---------+-----------+----------+--------------+  PFV       Full                                                             +---------+---------------+---------+-----------+----------+--------------+  POP       Full            Yes       Yes                                    +---------+---------------+---------+-----------+----------+--------------+  PTV       Full                                                             +---------+---------------+---------+-----------+----------+--------------+  PERO      Full                                                             +---------+---------------+---------+-----------+----------+--------------+     Summary: Right: There is no evidence of deep  vein thrombosis in the lower extremity. However, portions of this examination were limited- see technologist comments above. No cystic structure found in the popliteal fossa. Left: There is no evidence of deep vein thrombosis in the lower extremity. However, portions of this examination were limited- see technologist comments above. No cystic structure found in the popliteal fossa.  *See table(s) above for measurements and observations. Electronically signed by Deitra Mayo MD on 12/26/2018 at 4:40:29 PM.    Final

## 2018-12-28 NOTE — Progress Notes (Signed)
Day RN family after 5 pm and updated on patient's condition.  Per patient, no need to call family tonight.  She has been texting with sister.  Earleen Reaper RN

## 2018-12-28 NOTE — Progress Notes (Signed)
Updated the patient's sister, Nelva Bush via phone. Questions encouraged and answered. She is planning on dropping off underwear for the patient tomorrow between 10:00am and 2:00pm.

## 2018-12-29 LAB — CBC WITH DIFFERENTIAL/PLATELET
Abs Immature Granulocytes: 0.22 10*3/uL — ABNORMAL HIGH (ref 0.00–0.07)
Basophils Absolute: 0 10*3/uL (ref 0.0–0.1)
Basophils Relative: 1 %
Eosinophils Absolute: 0 10*3/uL (ref 0.0–0.5)
Eosinophils Relative: 0 %
HCT: 42.6 % (ref 36.0–46.0)
Hemoglobin: 13.6 g/dL (ref 12.0–15.0)
Immature Granulocytes: 3 %
Lymphocytes Relative: 28 %
Lymphs Abs: 2.3 10*3/uL (ref 0.7–4.0)
MCH: 28.3 pg (ref 26.0–34.0)
MCHC: 31.9 g/dL (ref 30.0–36.0)
MCV: 88.8 fL (ref 80.0–100.0)
Monocytes Absolute: 0.6 10*3/uL (ref 0.1–1.0)
Monocytes Relative: 7 %
Neutro Abs: 5.2 10*3/uL (ref 1.7–7.7)
Neutrophils Relative %: 61 %
Platelets: 281 10*3/uL (ref 150–400)
RBC: 4.8 MIL/uL (ref 3.87–5.11)
RDW: 13.8 % (ref 11.5–15.5)
WBC: 8.3 10*3/uL (ref 4.0–10.5)
nRBC: 0 % (ref 0.0–0.2)

## 2018-12-29 LAB — COMPREHENSIVE METABOLIC PANEL
ALT: 158 U/L — ABNORMAL HIGH (ref 0–44)
AST: 55 U/L — ABNORMAL HIGH (ref 15–41)
Albumin: 3.4 g/dL — ABNORMAL LOW (ref 3.5–5.0)
Alkaline Phosphatase: 60 U/L (ref 38–126)
Anion gap: 10 (ref 5–15)
BUN: 17 mg/dL (ref 6–20)
CO2: 22 mmol/L (ref 22–32)
Calcium: 8.2 mg/dL — ABNORMAL LOW (ref 8.9–10.3)
Chloride: 108 mmol/L (ref 98–111)
Creatinine, Ser: 0.53 mg/dL (ref 0.44–1.00)
GFR calc Af Amer: >60 mL/min (ref >60)
GFR calc non Af Amer: >60 mL/min (ref >60)
Glucose, Bld: 150 mg/dL — ABNORMAL HIGH (ref 70–99)
Potassium: 3.9 mmol/L (ref 3.5–5.1)
Sodium: 140 mmol/L (ref 135–145)
Total Bilirubin: 0.3 mg/dL (ref 0.3–1.2)
Total Protein: 6.9 g/dL (ref 6.5–8.1)

## 2018-12-29 LAB — FERRITIN: Ferritin: 95 ng/mL (ref 11–307)

## 2018-12-29 LAB — LACTATE DEHYDROGENASE: LDH: 242 U/L — ABNORMAL HIGH (ref 98–192)

## 2018-12-29 LAB — D-DIMER, QUANTITATIVE: D-Dimer, Quant: 0.52 ug/mL-FEU — ABNORMAL HIGH (ref 0.00–0.50)

## 2018-12-29 LAB — C-REACTIVE PROTEIN: CRP: <0.8 mg/dL (ref <1.0)

## 2018-12-29 LAB — MAGNESIUM: Magnesium: 2.3 mg/dL (ref 1.7–2.4)

## 2018-12-29 MED ORDER — ENOXAPARIN SODIUM 60 MG/0.6ML ~~LOC~~ SOLN
60.0000 mg | SUBCUTANEOUS | Status: DC
  Administered 2018-12-29: 60 mg via SUBCUTANEOUS
  Filled 2018-12-29: qty 0.6

## 2018-12-29 NOTE — Progress Notes (Signed)
PROGRESS NOTE                                                                                                                                                                                                             Patient Demographics:    Nancy Weeks, is a 23 y.o. female, DOB - Jan 12, 1996, AYT:[id number]  Outpatient Primary MD for the patient is System, Pcp Not In   Admit date - 12/25/2018   LOS - 4  No chief complaint on file.      Brief Narrative: Patient is a 23 y.o. female with PMHx of morbid obesity-presented to the hospital with shortness of breath, cough, fever-found to have acute hypoxemic respiratory failure in the setting of COVID-19 pneumonia.  See below for further details.   Subjective:   Patient in bed, appears comfortable, denies any headache, no fever, no chest pain or pressure, no shortness of breath , no abdominal pain. No focal weakness.   Assessment  & Plan :   Acute Hypoxic Resp Failure due to Covid 19 Viral pneumonia: Appears essentially the same-on 2-3 L of oxygen.  Inflammatory markers are downtrending.  Continue Remdesivir and steroids.  CTA chest/lower extremity Dopplers-negative for VTE.  Advance activity, encouraged to sit up in chair, likely room air this morning, she likely has undiagnosed sleep apnea and obesity related hypoventilation requiring some oxygen at night when is she sleeps.  Most likely outpatient sleep study will benefit.  For now plan will be to discharge her tomorrow if she continues to improve.     COVID-19 Labs: Recent Labs    12/27/18 0127 12/28/18 0111 12/29/18 0310  DDIMER 0.57* 0.54* 0.52*  FERRITIN 97 105 95  LDH  --   --  242*  CRP 1.1* <0.8 <0.8    Lab Results  Component Value Date   SARSCOV2NAA DETECTED (A) 12/23/2018    Hepatic Function Latest Ref Rng & Units 12/29/2018 12/28/2018 12/27/2018  Total Protein 6.5 - 8.1 g/dL 6.9 6.8 6.8    Albumin 3.5 - 5.0 g/dL 3.4(L) 3.5 3.5  AST 15 - 41 U/L 55(H) 52(H) 67(H)  ALT 0 - 44 U/L 158(H) 129(H) 122(H)  Alk Phosphatase 38 - 126 U/L 60 58 59  Total Bilirubin 0.3 - 1.2 mg/dL 0.3 0.3 0.2(L)     Transaminitis: Due to combination of COVID-19  infection and possibly Remdisvir, monitor trend, asymptomatic.  Morbid Obesity: BMI 51, follow with PCP for weight loss.   Condition - Stable  Family Communication  : Sister updated over the phone on 8/28  Code Status :  Full Code  Diet :  Diet Order            Diet regular Room service appropriate? Yes; Fluid consistency: Thin  Diet effective now               Disposition Plan  : Home morning of 12/30/2018.  Consults  :  None  Procedures  :  None  DVT Prophylaxis  :  Lovenox   Antibiotics  :    Anti-infectives (From admission, onward)   Start     Dose/Rate Route Frequency Ordered Stop   12/26/18 2000  remdesivir 100 mg in sodium chloride 0.9 % 250 mL IVPB     100 mg 500 mL/hr over 30 Minutes Intravenous Every 24 hours 12/25/18 1844 12/30/18 1959   12/25/18 2000  remdesivir 200 mg in sodium chloride 0.9 % 250 mL IVPB     200 mg 500 mL/hr over 30 Minutes Intravenous Once 12/25/18 1844 12/26/18 0526      Inpatient Medications  Scheduled Meds:  methylPREDNISolone (SOLU-MEDROL) injection  40 mg Intravenous Q12H   sodium chloride flush  3 mL Intravenous Q12H   vitamin C  500 mg Oral Daily   zinc sulfate  220 mg Oral Daily   Continuous Infusions:  remdesivir 100 mg in NS 250 mL Stopped (12/28/18 2249)   PRN Meds:.acetaminophen, albuterol, chlorpheniramine-HYDROcodone, clonazePAM, guaiFENesin-dextromethorphan, ondansetron **OR** ondansetron (ZOFRAN) IV, polyethylene glycol, traMADol   Time Spent in minutes  25  See all Orders from today for further details   Lala Lund M.D on 12/29/2018 at 9:44 AM  To page go to [email or web address] - use universal password  [facility] -  Office  [phone]     Objective:   Vitals:   12/29/18 0300 12/29/18 0434 12/29/18 0810 12/29/18 0919  BP:  101/64 102/62   Pulse: 74 88 79   Resp:  20 18   Temp:  98.8 F (37.1 C) 98 F (36.7 C)   TempSrc:  Oral Oral   SpO2: 92% 93% 94% 100%  Weight:      Height:        Wt Readings from Last 3 Encounters:  12/25/18 127 kg  12/25/18 127 kg  12/23/18 127 kg     Intake/Output Summary (Last 24 hours) at 12/29/2018 0944 Last data filed at 12/29/2018 0600 Gross per 24 hour  Intake 1219.63 ml  Output 1 ml  Net 1218.63 ml     Physical Exam  Awake Alert, Oriented X 3, No new F.N deficits, Normal affect [place].AT,PERRAL Supple Neck,No JVD, No cervical lymphadenopathy appriciated.  Symmetrical Chest wall movement, Good air movement bilaterally, CTAB RRR,No Gallops, Rubs or new Murmurs, No Parasternal Heave +ve B.Sounds, Abd Soft, No tenderness, No organomegaly appriciated, No rebound - guarding or rigidity. No Cyanosis, Clubbing or edema, No new Rash or bruise    Data Review:    CBC Recent Labs  Lab 12/25/18 1256 12/26/18 0114 12/27/18 0127 12/28/18 0111 12/29/18 0310  WBC 3.3* 2.5* 6.3 6.6 8.3  HGB 14.0 13.3 12.7 13.3 13.6  HCT 42.4 41.2 39.3 40.6 42.6  PLT 172 196 216 255 281  MCV 86.0 88.2 88.9 89.0 88.8  MCH 28.4 28.5 28.7 29.2 28.3  MCHC 33.0 32.3 32.3 32.8 31.9  RDW 13.7  13.8 14.1 14.0 13.8  LYMPHSABS 1.1 0.8 1.2 1.5 2.3  MONOABS 0.3 0.1 0.4 0.5 0.6  EOSABS 0.0 0.0 0.0 0.0 0.0  BASOSABS 0.0 0.0 0.0 0.0 0.0    Chemistries  Recent Labs  Lab 12/25/18 1256 12/26/18 0114 12/27/18 0127 12/28/18 0111 12/29/18 0310  NA 139 140 142 141 140  K 3.5 3.7 4.0 3.9 3.9  CL 105 110 109 110 108  CO2 _0 GLUCOSE 119* 159* 159* 157* 150*  BUN _1 CREATININE 0.55 0.55 0.55 0.55 0.53  CALCIUM 8.4* 8.4* 8.3* 8.3* 8.2*  MG  --   --   --   --  2.3  AST 44* 63* 67* 52* 55*  ALT 70* 88* 122* 129* 158*  ALKPHOS 63 68 59 58 60  BILITOT 0.5 0.4 0.2* 0.3 0.3    ------------------------------------------------------------------------------------------------------------------ No results for input(s): CHOL, HDL, LDLCALC, TRIG, CHOLHDL, LDLDIRECT in the last 72 hours.  No results found for: HGBA1C ------------------------------------------------------------------------------------------------------------------ No results for input(s): TSH, T4TOTAL, T3FREE, THYROIDAB in the last 72 hours.  Invalid input(s): FREET3 ------------------------------------------------------------------------------------------------------------------ Recent Labs    12/28/18 0111 12/29/18 0310  FERRITIN 105 95    Coagulation profile No results for input(s): INR, PROTIME in the last 168 hours.  Recent Labs    12/28/18 0111 12/29/18 0310  DDIMER 0.54* 0.52*    Cardiac Enzymes No results for input(s): CKMB, TROPONINI, MYOGLOBIN in the last 168 hours.  Invalid input(s): CK ------------------------------------------------------------------------------------------------------------------ No results found for: BNP  Micro Results Recent Results (from the past 240 hour(s))  Novel Coronavirus, NAA (Hosp order, Send-out to Ref Lab; TAT 18-24 hrs     Status: Abnormal   Collection Time: 12/23/18  2:57 PM   Specimen: Nasopharyngeal Swab; Respiratory  Result Value Ref Range Status   SARS-CoV-2, NAA DETECTED (A) NOT DETECTED Final    Comment: CRITICAL RESULT CALLED TO, READ BACK BY AND VERIFIED WITH: TIFFANY JOHNSON 12/24/2018 1300 HKP (NOTE)                  Client Requested Flag This test was developed and its performance characteristics determined by [employer]. This test has not been FDA cleared or approved. This test has been authorized by FDA under an Emergency Use Authorization (EUA). This test is only authorized for the duration of time the declaration that circumstances exist justifying the authorization of the emergency use of in vitro diagnostic  tests for detection of SARS-CoV-2 virus and/or diagnosis of COVID-19 infection under section 564(b)(1) of the Act, 21 U.S.C. 327MDY-7(W)(9), unless the authorization is terminated or revoked sooner. When diagnostic testing is negative, the possibility of a false negative result should be considered in the context of a patient's recent exposures and the presence of clinical signs and symptoms consistent with COVID-19. An individual without symptoms  of COVID-19 and who is not shedding SARS-CoV-2 virus would expect to have a negative (not detected) result in this assay. Performed At: [facility] [address] [place], [place] [id number] Rush Farmer MD [id number]    [place]  Final    Comment: Performed at [facility], [address] [place], [place] [address]  Blood culture (routine x 2)     Status: None (Preliminary result)   Collection Time: 12/25/18  3:00 PM   Specimen: BLOOD  Result Value Ref Range Status   Specimen Description BLOOD RIGHT WIST  Final   Special Requests   Final  BOTTLES DRAWN AEROBIC AND ANAEROBIC Blood Culture adequate volume   Culture   Final    NO GROWTH 4 DAYS Performed at [facility], [address] [place], [place] [address]    Report Status PENDING  Incomplete  Blood culture (routine x 2)     Status: None (Preliminary result)   Collection Time: 12/25/18  3:00 PM   Specimen: BLOOD  Result Value Ref Range Status   Specimen Description BLOOD LEFT ANTECUBITAL  Final   Special Requests   Final    BOTTLES DRAWN AEROBIC AND ANAEROBIC Blood Culture results may not be optimal due to an excessive volume of blood received in culture bottles   Culture   Final    NO GROWTH 4 DAYS Performed at [facility], [address] [place], [place] [address]    Report Status PENDING  Incomplete    Radiology Reports Dg Chest 1 View  Result Date: 12/23/2018 CLINICAL DATA:  Shortness of breath,  fevers for 3 days, COVID-19 positive contact EXAM: CHEST  1 VIEW COMPARISON:  None. FINDINGS: There is elevation the right hemidiaphragm with adjacent hazy opacity likely reflective of atelectasis though can not exclude underlying consolidation. The pulmonary vascularity is normal. No pneumothorax. No visible effusion. Cardiomediastinal contours are unremarkable for portable seated technique. No acute osseous or soft tissue abnormality IMPRESSION: Elevation of the right hemidiaphragm with adjacent hazy opacity likely reflective of atelectasis though can not exclude underlying consolidation/infection. Electronically Signed   By: Lovena Le M.D.   On: 12/23/2018 14:22   Ct Angio Chest Pe W And/or Wo Contrast  Result Date: 12/25/2018 CLINICAL DATA:  Pt presents to ED via POV with c/o fever, SOB, and cough. Pt with noted SOB in triage, and febrile, pt states tested 2 days when she was here, however was not told her results, with +Covid test on 8/25. 79m of OMni 350 used.^768mOMNIPAQUE IOHEXOL 350 MG/ML SOLN EXAM: CT ANGIOGRAPHY CHEST WITH CONTRAST TECHNIQUE: Multidetector CT imaging of the chest was performed using the standard protocol during bolus administration of intravenous contrast. Multiplanar CT image reconstructions and MIPs were obtained to evaluate the vascular anatomy. CONTRAST:  7559mMNIPAQUE IOHEXOL 350 MG/ML SOLN COMPARISON:  Same day chest radiograph FINDINGS: Cardiovascular: Suboptimal opacification of the pulmonary arteries limiting evaluation beyond the central vessels. There is no large central pulmonary embolus. Mediastinum/Nodes: No enlarged mediastinal, hilar, or axillary lymph nodes. Thyroid gland, trachea, and esophagus demonstrate no significant findings. Lungs/Pleura: There is ground-glass opacity throughout the right apically and posterior lung as well as in the left lower lobe. There are consolidative opacities in the right lung base as well as smaller area in the posterior right  apex and posterior left lower lobe suspicious for multifocal pneumonia. No pneumothorax or pleural effusion. Upper Abdomen: Fatty infiltration of the liver. Musculoskeletal: No chest wall abnormality. No acute or significant osseous findings. Review of the MIP images confirms the above findings. IMPRESSION: 1. Suboptimal opacification of the pulmonary arteries significantly limiting evaluation beyond the central vessels. No large central pulmonary embolus. 2. Ground-glass opacity throughout much of the right lung with multifocal consolidation predominantly in the right lower lobe but also smaller areas in the right lung apex and posterior left lower lobe suspicious for multifocal pneumonia. 3. Hepatic steatosis. Electronically Signed   By: NanAudie PintoD.   On: 12/25/2018 15:49   Dg Chest Portable 1 View  Result Date: 12/25/2018 CLINICAL DATA:  Cough, shortness of breath, evaluate for pneumonia, positive COVID-19 EXAM:  PORTABLE CHEST 1 VIEW COMPARISON:  12/23/2018 FINDINGS: Low volume AP portable examination, not significantly changed compared to prior examination. No acute appearing airspace opacity. IMPRESSION: Low volume AP portable examination, not significantly changed compared to prior examination. No acute appearing airspace opacity. Electronically Signed   By: Eddie Candle M.D.   On: 12/25/2018 12:55   Dg Chest Port 1v Same Day  Result Date: 12/27/2018 CLINICAL DATA:  Shortness of breath. EXAM: PORTABLE CHEST 1 VIEW COMPARISON:  Chest x-ray and CT chest dated December 25, 2018. FINDINGS: The heart size and mediastinal contours are within normal limits. Normal pulmonary vascularity. Low lung volumes. Similar appearing patchy opacity in the right upper and lower lobes. No pleural effusion or pneumothorax. Unchanged elevation of the right hemidiaphragm. No acute osseous abnormality. IMPRESSION: 1. Similar appearing multifocal pneumonia in the right lung. Electronically Signed   By: Titus Dubin  M.D.   On: 12/27/2018 10:56   Vas [place] Lower Extremity Venous (dvt)  Result Date: 12/26/2018  Lower Venous Study Indications: Elevated Ddimer.  Risk Factors: COVID 19 positive. Limitations: Body habitus and poor ultrasound/tissue interface. Comparison Study: No prior studies. Performing Technologist: Oliver Hum RVT  Examination Guidelines: A complete evaluation includes B-mode imaging, spectral Doppler, color Doppler, and power Doppler as needed of all accessible portions of each vessel. Bilateral testing is considered an integral part of a complete examination. Limited examinations for reoccurring indications may be performed as noted.  +---------+---------------+---------+-----------+----------+--------------+  RIGHT     Compressibility Phasicity Spontaneity Properties Thrombus Aging  +---------+---------------+---------+-----------+----------+--------------+  CFV       Full            Yes       Yes                                    +---------+---------------+---------+-----------+----------+--------------+  SFJ       Full                                                             +---------+---------------+---------+-----------+----------+--------------+  FV Prox   Full                                                             +---------+---------------+---------+-----------+----------+--------------+  FV Mid    Full                                                             +---------+---------------+---------+-----------+----------+--------------+  FV Distal Full                                                             +---------+---------------+---------+-----------+----------+--------------+  PFV  Full                                                             +---------+---------------+---------+-----------+----------+--------------+  POP       Full            Yes       Yes                                    +---------+---------------+---------+-----------+----------+--------------+  PTV        Full                                                             +---------+---------------+---------+-----------+----------+--------------+  PERO      Full                                                             +---------+---------------+---------+-----------+----------+--------------+   +---------+---------------+---------+-----------+----------+--------------+  LEFT      Compressibility Phasicity Spontaneity Properties Thrombus Aging  +---------+---------------+---------+-----------+----------+--------------+  CFV       Full            Yes       Yes                                    +---------+---------------+---------+-----------+----------+--------------+  SFJ       Full                                                             +---------+---------------+---------+-----------+----------+--------------+  FV Prox   Full                                                             +---------+---------------+---------+-----------+----------+--------------+  FV Mid    Full                                                             +---------+---------------+---------+-----------+----------+--------------+  FV Distal Full                                                             +---------+---------------+---------+-----------+----------+--------------+  PFV       Full                                                             +---------+---------------+---------+-----------+----------+--------------+  POP       Full            Yes       Yes                                    +---------+---------------+---------+-----------+----------+--------------+  PTV       Full                                                             +---------+---------------+---------+-----------+----------+--------------+  PERO      Full                                                             +---------+---------------+---------+-----------+----------+--------------+     Summary: Right: There is no evidence of deep  vein thrombosis in the lower extremity. However, portions of this examination were limited- see technologist comments above. No cystic structure found in the popliteal fossa. Left: There is no evidence of deep vein thrombosis in the lower extremity. However, portions of this examination were limited- see technologist comments above. No cystic structure found in the popliteal fossa.  *See table(s) above for measurements and observations. Electronically signed by Deitra Mayo MD on 12/26/2018 at 4:40:29 PM.    Final

## 2018-12-30 DIAGNOSIS — U071 COVID-19: Principal | ICD-10-CM

## 2018-12-30 DIAGNOSIS — J1289 Other viral pneumonia: Secondary | ICD-10-CM

## 2018-12-30 DIAGNOSIS — J9601 Acute respiratory failure with hypoxia: Secondary | ICD-10-CM

## 2018-12-30 LAB — CULTURE, BLOOD (ROUTINE X 2)
Culture: NO GROWTH
Culture: NO GROWTH
Special Requests: ADEQUATE

## 2018-12-30 MED ORDER — METHYLPREDNISOLONE 4 MG PO TBPK: ORAL_TABLET | ORAL | 0 refills | Status: DC

## 2018-12-30 MED ORDER — ALBUTEROL SULFATE HFA 108 (90 BASE) MCG/ACT IN AERS: 2.0000 | INHALATION_SPRAY | Freq: Four times a day (QID) | RESPIRATORY_TRACT | 0 refills | Status: DC | PRN

## 2018-12-30 NOTE — Plan of Care (Signed)
Written and verbal instructions given to patient. Patient verbalized understanding of instructions.  Discharged patient to home via private vehicle. 

## 2018-12-30 NOTE — Discharge Instructions (Signed)
Follow with Primary MD in 7 days   Get CBC, CMP, 2 view Chest X ray -  checked next visit within 1 week by Primary MD   Activity: As tolerated with Full fall precautions use walker/cane & assistance as needed  Disposition Home   Diet: Heart Healthy     Special Instructions: If you have smoked or chewed Tobacco  in the last 2 yrs please stop smoking, stop any regular Alcohol  and or any Recreational drug use.  On your next visit with your primary care physician please Get Medicines reviewed and adjusted.  Please request your Prim.MD to go over all Hospital Tests and Procedure/Radiological results at the follow up, please get all Hospital records sent to your Prim MD by signing hospital release before you go home.  If you experience worsening of your admission symptoms, develop shortness of breath, life threatening emergency, suicidal or homicidal thoughts you must seek medical attention immediately by calling 911 or calling your MD immediately  if symptoms less severe.  You Must read complete instructions/literature along with all the possible adverse reactions/side effects for all the Medicines you take and that have been prescribed to you. Take any new Medicines after you have completely understood and accpet all the possible adverse reactions/side effects.       Person Under Monitoring Name: Nancy Weeks  Location: [address] [place] [address]   Infection Prevention Recommendations for Individuals Confirmed to have, or Being Evaluated for, 2019 Novel Coronavirus (COVID-19) Infection Who Receive Care at Home  Individuals who are confirmed to have, or are being evaluated for, COVID-19 should follow the prevention steps below until a healthcare provider or local or state health department says they can return to normal activities.  Stay home except to get medical care You should restrict activities outside your home, except for getting medical care. Do not go to  work, school, or public areas, and do not use public transportation or taxis.  Call ahead before visiting your doctor Before your medical appointment, call the healthcare provider and tell them that you have, or are being evaluated for, COVID-19 infection. This will help the healthcare providers office take steps to keep other people from getting infected. Ask your healthcare provider to call the local or state health department.  Monitor your symptoms Seek prompt medical attention if your illness is worsening (e.g., difficulty breathing). Before going to your medical appointment, call the healthcare provider and tell them that you have, or are being evaluated for, COVID-19 infection. Ask your healthcare provider to call the local or state health department.  Wear a facemask You should wear a facemask that covers your nose and mouth when you are in the same room with other people and when you visit a healthcare provider. People who live with or visit you should also wear a facemask while they are in the same room with you.  Separate yourself from other people in your home As much as possible, you should stay in a different room from other people in your home. Also, you should use a separate bathroom, if available.  Avoid sharing household items You should not share dishes, drinking glasses, cups, eating utensils, towels, bedding, or other items with other people in your home. After using these items, you should wash them thoroughly with soap and water.  Cover your coughs and sneezes Cover your mouth and nose with a tissue when you cough or sneeze, or you can cough or sneeze into your  sleeve. Throw used tissues in a lined trash can, and immediately wash your hands with soap and water for at least 20 seconds or use an alcohol-based hand rub.  Wash your [employer] your hands often and thoroughly with soap and water for at least 20 seconds. You can use an alcohol-based hand sanitizer if  soap and water are not available and if your hands are not visibly dirty. Avoid touching your eyes, nose, and mouth with unwashed hands.   Prevention Steps for Caregivers and Household Members of Individuals Confirmed to have, or Being Evaluated for, COVID-19 Infection Being Cared for in the Home  If you live with, or provide care at home for, a person confirmed to have, or being evaluated for, COVID-19 infection please follow these guidelines to prevent infection:  Follow healthcare providers instructions Make sure that you understand and can help the patient follow any healthcare provider instructions for all care.  Provide for the patients basic needs You should help the patient with basic needs in the home and provide support for getting groceries, prescriptions, and other personal needs.  Monitor the patients symptoms If they are getting sicker, call his or her medical provider and tell them that the patient has, or is being evaluated for, COVID-19 infection. This will help the healthcare providers office take steps to keep other people from getting infected. Ask the healthcare provider to call the local or state health department.  Limit the number of people who have contact with the patient  If possible, have only one caregiver for the patient.  Other household members should stay in another home or place of residence. If this is not possible, they should stay  in another room, or be separated from the patient as much as possible. Use a separate bathroom, if available.  Restrict visitors who do not have an essential need to be in the home.  Keep older adults, very young children, and other sick people away from the patient Keep older adults, very young children, and those who have compromised immune systems or chronic health conditions away from the patient. This includes people with chronic heart, lung, or kidney conditions, diabetes, and cancer.  Ensure good  ventilation Make sure that shared spaces in the home have good air flow, such as from an air conditioner or an opened window, weather permitting.  Wash your hands often  Wash your hands often and thoroughly with soap and water for at least 20 seconds. You can use an alcohol based hand sanitizer if soap and water are not available and if your hands are not visibly dirty.  Avoid touching your eyes, nose, and mouth with unwashed hands.  Use disposable paper towels to dry your hands. If not available, use dedicated cloth towels and replace them when they become wet.  Wear a facemask and gloves  Wear a disposable facemask at all times in the room and gloves when you touch or have contact with the patients blood, body fluids, and/or secretions or excretions, such as sweat, saliva, sputum, nasal mucus, vomit, urine, or feces.  Ensure the mask fits over your nose and mouth tightly, and do not touch it during use.  Throw out disposable facemasks and gloves after using them. Do not reuse.  Wash your hands immediately after removing your facemask and gloves.  If your personal clothing becomes contaminated, carefully remove clothing and launder. Wash your hands after handling contaminated clothing.  Place all used disposable facemasks, gloves, and other waste in a lined  container before disposing them with other household waste.  Remove gloves and wash your hands immediately after handling these items.  Do not share dishes, glasses, or other household items with the patient  Avoid sharing household items. You should not share dishes, drinking glasses, cups, eating utensils, towels, bedding, or other items with a patient who is confirmed to have, or being evaluated for, COVID-19 infection.  After the person uses these items, you should wash them thoroughly with soap and water.  Wash laundry thoroughly  Immediately remove and wash clothes or bedding that have blood, body fluids, and/or  secretions or excretions, such as sweat, saliva, sputum, nasal mucus, vomit, urine, or feces, on them.  Wear gloves when handling laundry from the patient.  Read and follow directions on labels of laundry or clothing items and detergent. In general, wash and dry with the warmest temperatures recommended on the label.  Clean all areas the individual has used often  Clean all touchable surfaces, such as counters, tabletops, doorknobs, bathroom fixtures, toilets, phones, keyboards, tablets, and bedside tables, every day. Also, clean any surfaces that may have blood, body fluids, and/or secretions or excretions on them.  Wear gloves when cleaning surfaces the patient has come in contact with.  Use a diluted bleach solution (e.g., dilute bleach with 1 part bleach and 10 parts water) or a household disinfectant with a label that says EPA-registered for coronaviruses. To make a bleach solution at home, add 1 tablespoon of bleach to 1 quart (4 cups) of water. For a larger supply, add  cup of bleach to 1 gallon (16 cups) of water.  Read labels of cleaning products and follow recommendations provided on product labels. Labels contain instructions for safe and effective use of the cleaning product including precautions you should take when applying the product, such as wearing gloves or eye protection and making sure you have good ventilation during use of the product.  Remove gloves and wash hands immediately after cleaning.  Monitor yourself for signs and symptoms of illness Caregivers and household members are considered close contacts, should monitor their health, and will be asked to limit movement outside of the home to the extent possible. Follow the monitoring steps for close contacts listed on the symptom monitoring form.   ? If you have additional questions, contact your local health department or call the epidemiologist on call at [phone] (available 24/7). ? This guidance is subject  to change. For the most up-to-date guidance from [facility], please refer to their website: [employer]Met[employer].huhttps://www.cdc.gov/coronavirus/2019-ncov/hcp/guidance-prevent-spread.html

## 2018-12-30 NOTE — Discharge Summary (Signed)
Nancy Weeks LKJ:[id number] DOB: [birth year] DOA: 12/25/2018  PCP: System, Pcp Not In  Admit date: 12/25/2018  Discharge date: 12/30/2018  Admitted From: Home  Disposition:  Home   Recommendations for Outpatient Follow-up:   Follow up with PCP in 1-2 Weeks  PCP Please obtain BMP/CBC, 2 view CXR in 1week,  (see Discharge instructions)   PCP Please follow up on the following pending results: None   Home Health:  None Equipment/Devices:  None Consultations: None  Discharge Condition: Stable    CODE STATUS: Full    Diet Recommendation: Heart Healthy   Diet Order            Diet - low sodium heart healthy        Diet regular Room service appropriate? Yes; Fluid consistency: Thin  Diet effective now               CC = SOB   Brief history of present illness from the day of admission and additional interim summary    Patient is a 23 y.o. female with PMHx of morbid obesity-presented to the hospital with shortness of breath, cough, fever-found to have acute hypoxemic respiratory failure in the setting of COVID-19 pneumonia.  See below for further details.                                                                  Hospital Course   Acute Hypoxic Resp Failure due to Covid 19 Viral pneumonia: Appears essentially the same-on 2-3 L of oxygen.  Inflammatory markers are downtrending.  Continue Remdesivir and steroids.  CTA chest/lower extremity Dopplers-negative for VTE.    She is now symptom-free and stable on room air, she does have underlying morbid obesity and likely undiagnosed obstructive sleep apnea and will benefit from outpatient sleep study.  She will be discharged home on albuterol rescue inhaler along with short course of oral steroid taper.  COVID-19 Labs  Recent Labs    12/28/18 0111  12/29/18 0310  DDIMER 0.54* 0.52*  FERRITIN 105 95  LDH  --  242*  CRP <0.8 <0.8    Lab Results  Component Value Date   SARSCOV2NAA DETECTED (A) 12/23/2018    Hepatic Function Latest Ref Rng & Units 12/29/2018 12/28/2018 12/27/2018  Total Protein 6.5 - 8.1 g/dL 6.9 6.8 6.8  Albumin 3.5 - 5.0 g/dL 3.4(L) 3.5 3.5  AST 15 - 41 U/L 55(H) 52(H) 67(H)  ALT 0 - 44 U/L 158(H) 129(H) 122(H)  Alk Phosphatase 38 - 126 U/L 60 58 59  Total Bilirubin 0.3 - 1.2 mg/dL 0.3 0.3 0.2(L)    Transaminitis: Due to combination of COVID-19 infection and possibly Remdisvir, trend relatively stable and she is completely symptom-free plan is for PCP to repeat CMP in 7 to 10 days post discharge.  She also has underlying fatty liver for which she needs to follow with PCP and work on weight loss.  Morbid Obesity: BMI 51, follow with PCP for weight loss.   Discharge diagnosis     Active Problems:   Acute respiratory failure with hypoxia (HCC)   Pneumonia due to COVID-19 virus   SIRS (systemic inflammatory response syndrome) (HCC)   Obesity, Class III, BMI 40-49.9 (morbid obesity) ([place])    Discharge instructions    Discharge Instructions    Diet - low sodium heart healthy   Complete by: As directed    Discharge instructions   Complete by: As directed    Follow with Primary MD in 7 days   Get CBC, CMP, 2 view Chest X ray -  checked next visit within 1 week by Primary MD   Activity: As tolerated with Full fall precautions use walker/cane & assistance as needed  Disposition Home   Diet: Heart Healthy     Special Instructions: If you have smoked or chewed Tobacco  in the last 2 yrs please stop smoking, stop any regular Alcohol  and or any Recreational drug use.  On your next visit with your primary care physician please Get Medicines reviewed and adjusted.  Please request your Prim.MD to go over all Hospital Tests and Procedure/Radiological results at the follow up, please get all Hospital  records sent to your Prim MD by signing hospital release before you go home.  If you experience worsening of your admission symptoms, develop shortness of breath, life threatening emergency, suicidal or homicidal thoughts you must seek medical attention immediately by calling 911 or calling your MD immediately  if symptoms less severe.  You Must read complete instructions/literature along with all the possible adverse reactions/side effects for all the Medicines you take and that have been prescribed to you. Take any new Medicines after you have completely understood and accpet all the possible adverse reactions/side effects.   Increase activity slowly   Complete by: As directed    MyChart COVID-19 home monitoring program   Complete by: Dec 30, 2018    Is the patient willing to use the [place] for home monitoring?: Yes   Temperature monitoring   Complete by: Dec 30, 2018    After how many days would you like to receive a notification of this patient's flowsheet entries?: 1      Discharge Medications   Allergies as of 12/30/2018   No Known Allergies     Medication List    STOP taking these medications   azithromycin 250 MG tablet Commonly known as: Zithromax Z-Pak     TAKE these medications   albuterol 108 (90 Base) MCG/ACT inhaler Commonly known as: VENTOLIN HFA Inhale 2 puffs into the lungs every 6 (six) hours as needed for wheezing or shortness of breath.   benzonatate 100 MG capsule Commonly known as: Tessalon Perles Take 1 capsule (100 mg total) by mouth 3 (three) times daily as needed for cough.   methylPREDNISolone 4 MG Tbpk tablet Commonly known as: MEDROL DOSEPAK follow package directions       Follow-up Information    PCP. Schedule an appointment as soon as possible for a visit in 1 week(s).           Major procedures and Radiology Reports - PLEASE review detailed and final reports thoroughly  -         Dg Chest 1 View  Result Date:  12/23/2018 CLINICAL DATA:  Shortness of  breath, fevers for 3 days, COVID-19 positive contact EXAM: CHEST  1 VIEW COMPARISON:  None. FINDINGS: There is elevation the right hemidiaphragm with adjacent hazy opacity likely reflective of atelectasis though can not exclude underlying consolidation. The pulmonary vascularity is normal. No pneumothorax. No visible effusion. Cardiomediastinal contours are unremarkable for portable seated technique. No acute osseous or soft tissue abnormality IMPRESSION: Elevation of the right hemidiaphragm with adjacent hazy opacity likely reflective of atelectasis though can not exclude underlying consolidation/infection. Electronically Signed   By: Lovena Le M.D.   On: 12/23/2018 14:22   Ct Angio Chest Pe W And/or Wo Contrast  Result Date: 12/25/2018 CLINICAL DATA:  Pt presents to ED via POV with c/o fever, SOB, and cough. Pt with noted SOB in triage, and febrile, pt states tested 2 days when she was here, however was not told her results, with +Covid test on 8/25. 75m of OMni 350 used.^743mOMNIPAQUE IOHEXOL 350 MG/ML SOLN EXAM: CT ANGIOGRAPHY CHEST WITH CONTRAST TECHNIQUE: Multidetector CT imaging of the chest was performed using the standard protocol during bolus administration of intravenous contrast. Multiplanar CT image reconstructions and MIPs were obtained to evaluate the vascular anatomy. CONTRAST:  7570mMNIPAQUE IOHEXOL 350 MG/ML SOLN COMPARISON:  Same day chest radiograph FINDINGS: Cardiovascular: Suboptimal opacification of the pulmonary arteries limiting evaluation beyond the central vessels. There is no large central pulmonary embolus. Mediastinum/Nodes: No enlarged mediastinal, hilar, or axillary lymph nodes. Thyroid gland, trachea, and esophagus demonstrate no significant findings. Lungs/Pleura: There is ground-glass opacity throughout the right apically and posterior lung as well as in the left lower lobe. There are consolidative opacities in the right lung base  as well as smaller area in the posterior right apex and posterior left lower lobe suspicious for multifocal pneumonia. No pneumothorax or pleural effusion. Upper Abdomen: Fatty infiltration of the liver. Musculoskeletal: No chest wall abnormality. No acute or significant osseous findings. Review of the MIP images confirms the above findings. IMPRESSION: 1. Suboptimal opacification of the pulmonary arteries significantly limiting evaluation beyond the central vessels. No large central pulmonary embolus. 2. Ground-glass opacity throughout much of the right lung with multifocal consolidation predominantly in the right lower lobe but also smaller areas in the right lung apex and posterior left lower lobe suspicious for multifocal pneumonia. 3. Hepatic steatosis. Electronically Signed   By: NanAudie PintoD.   On: 12/25/2018 15:49   Dg Chest Portable 1 View  Result Date: 12/25/2018 CLINICAL DATA:  Cough, shortness of breath, evaluate for pneumonia, positive COVID-19 EXAM: PORTABLE CHEST 1 VIEW COMPARISON:  12/23/2018 FINDINGS: Low volume AP portable examination, not significantly changed compared to prior examination. No acute appearing airspace opacity. IMPRESSION: Low volume AP portable examination, not significantly changed compared to prior examination. No acute appearing airspace opacity. Electronically Signed   By: AleEddie CandleD.   On: 12/25/2018 12:55   Dg Chest Port 1v Same Day  Result Date: 12/27/2018 CLINICAL DATA:  Shortness of breath. EXAM: PORTABLE CHEST 1 VIEW COMPARISON:  Chest x-ray and CT chest dated December 25, 2018. FINDINGS: The heart size and mediastinal contours are within normal limits. Normal pulmonary vascularity. Low lung volumes. Similar appearing patchy opacity in the right upper and lower lobes. No pleural effusion or pneumothorax. Unchanged elevation of the right hemidiaphragm. No acute osseous abnormality. IMPRESSION: 1. Similar appearing multifocal pneumonia in the right  lung. Electronically Signed   By: WilTitus DubinD.   On: 12/27/2018 10:56   Vas Us [place] Extremity Venous (dvt)  Result Date: 12/26/2018  Lower Venous Study Indications: Elevated Ddimer.  Risk Factors: COVID 19 positive. Limitations: Body habitus and poor ultrasound/tissue interface. Comparison Study: No prior studies. Performing Technologist: Oliver Hum RVT  Examination Guidelines: A complete evaluation includes B-mode imaging, spectral Doppler, color Doppler, and power Doppler as needed of all accessible portions of each vessel. Bilateral testing is considered an integral part of a complete examination. Limited examinations for reoccurring indications may be performed as noted.  +---------+---------------+---------+-----------+----------+--------------+  RIGHT     Compressibility Phasicity Spontaneity Properties Thrombus Aging  +---------+---------------+---------+-----------+----------+--------------+  CFV       Full            Yes       Yes                                    +---------+---------------+---------+-----------+----------+--------------+  SFJ       Full                                                             +---------+---------------+---------+-----------+----------+--------------+  FV Prox   Full                                                             +---------+---------------+---------+-----------+----------+--------------+  FV Mid    Full                                                             +---------+---------------+---------+-----------+----------+--------------+  FV Distal Full                                                             +---------+---------------+---------+-----------+----------+--------------+  PFV       Full                                                             +---------+---------------+---------+-----------+----------+--------------+  POP       Full            Yes       Yes                                     +---------+---------------+---------+-----------+----------+--------------+  PTV       Full                                                             +---------+---------------+---------+-----------+----------+--------------+  PERO      Full                                                             +---------+---------------+---------+-----------+----------+--------------+   +---------+---------------+---------+-----------+----------+--------------+  LEFT      Compressibility Phasicity Spontaneity Properties Thrombus Aging  +---------+---------------+---------+-----------+----------+--------------+  CFV       Full            Yes       Yes                                    +---------+---------------+---------+-----------+----------+--------------+  SFJ       Full                                                             +---------+---------------+---------+-----------+----------+--------------+  FV Prox   Full                                                             +---------+---------------+---------+-----------+----------+--------------+  FV Mid    Full                                                             +---------+---------------+---------+-----------+----------+--------------+  FV Distal Full                                                             +---------+---------------+---------+-----------+----------+--------------+  PFV       Full                                                             +---------+---------------+---------+-----------+----------+--------------+  POP       Full            Yes       Yes                                    +---------+---------------+---------+-----------+----------+--------------+  PTV       Full                                                             +---------+---------------+---------+-----------+----------+--------------+  PERO      Full                                                              +---------+---------------+---------+-----------+----------+--------------+     Summary: Right: There is no evidence of deep vein thrombosis in the lower extremity. However, portions of this examination were limited- see technologist comments above. No cystic structure found in the popliteal fossa. Left: There is no evidence of deep vein thrombosis in the lower extremity. However, portions of this examination were limited- see technologist comments above. No cystic structure found in the popliteal fossa.  *See table(s) above for measurements and observations. Electronically signed by Deitra Mayo MD on 12/26/2018 at 4:40:29 PM.    Final     Micro Results    Recent Results (from the past 240 hour(s))  Novel Coronavirus, NAA (Hosp order, Send-out to Ref Lab; TAT 18-24 hrs     Status: Abnormal   Collection Time: 12/23/18  2:57 PM   Specimen: Nasopharyngeal Swab; Respiratory  Result Value Ref Range Status   SARS-CoV-2, NAA DETECTED (A) NOT DETECTED Final    Comment: CRITICAL RESULT CALLED TO, READ BACK BY AND VERIFIED WITH: TIFFANY JOHNSON 12/24/2018 1300 HKP (NOTE)                  Client Requested Flag This test was developed and its performance characteristics determined by [employer]. This test has not been FDA cleared or approved. This test has been authorized by FDA under an Emergency Use Authorization (EUA). This test is only authorized for the duration of time the declaration that circumstances exist justifying the authorization of the emergency use of in vitro diagnostic tests for detection of SARS-CoV-2 virus and/or diagnosis of COVID-19 infection under section 564(b)(1) of the Act, 21 U.S.C. 161WRU-0(A)(5), unless the authorization is terminated or revoked sooner. When diagnostic testing is negative, the possibility of a false negative result should be considered in the context of a patient's recent exposures and the presence of clinical signs and symptoms consistent  with COVID-19. An individual without symptoms  of COVID-19 and who is not shedding SARS-CoV-2 virus would expect to have a negative (not detected) result in this assay. Performed At: [facility] [address] [place], [place] [id number] Rush Farmer MD NW:[id number]    [place]  Final    Comment: Performed at [facility], [address] [place], [place] [address]  Blood culture (routine x 2)     Status: None   Collection Time: 12/25/18  3:00 PM   Specimen: BLOOD  Result Value Ref Range Status   Specimen Description BLOOD RIGHT WIST  Final   Special Requests   Final    BOTTLES DRAWN AEROBIC AND ANAEROBIC Blood Culture adequate volume   Culture   Final    NO GROWTH 5 DAYS Performed at [facility], [address] [place], [place] [address]    Report Status 12/30/2018 FINAL  Final  Blood culture (routine x 2)     Status: None   Collection Time: 12/25/18  3:00 PM   Specimen: BLOOD  Result Value Ref Range Status   Specimen Description BLOOD LEFT ANTECUBITAL  Final   Special Requests   Final    BOTTLES DRAWN AEROBIC AND ANAEROBIC Blood  Culture results may not be optimal due to an excessive volume of blood received in culture bottles   Culture   Final    NO GROWTH 5 DAYS Performed at [facility], [address] [place], [place] [address]    Report Status 12/30/2018 FINAL  Final    Today   Subjective    Nancy Weeks today has no headache,no chest abdominal pain,no new weakness tingling or numbness, feels much better wants to go home today.     Objective   Blood pressure 106/72, pulse 67, temperature 98.5 F (36.9 C), temperature source Oral, resp. rate 17, height '5\' 2"'$  (1.575 m), weight 127 kg, SpO2 100 %.   Intake/Output Summary (Last 24 hours) at 12/30/2018 0944 Last data filed at 12/29/2018 1336 Gross per 24 hour  Intake 480 ml  Output --  Net 480 ml    Exam Awake Alert, Oriented x 3,  No new F.N deficits, Normal affect [place].AT,PERRAL Supple Neck,No JVD, No cervical lymphadenopathy appriciated.  Symmetrical Chest wall movement, Good air movement bilaterally, CTAB RRR,No Gallops,Rubs or new Murmurs, No Parasternal Heave +ve B.Sounds, Abd Soft, Non tender, No organomegaly appriciated, No rebound -guarding or rigidity. No Cyanosis, Clubbing or edema, No new Rash or bruise   Data Review   CBC w Diff:  Lab Results  Component Value Date   WBC 8.3 12/29/2018   HGB 13.6 12/29/2018   HCT 42.6 12/29/2018   PLT 281 12/29/2018   LYMPHOPCT 28 12/29/2018   MONOPCT 7 12/29/2018   EOSPCT 0 12/29/2018   BASOPCT 1 12/29/2018    CMP:  Lab Results  Component Value Date   NA 140 12/29/2018   K 3.9 12/29/2018   CL 108 12/29/2018   CO2 22 12/29/2018   BUN 17 12/29/2018   CREATININE 0.53 12/29/2018   PROT 6.9 12/29/2018   ALBUMIN 3.4 (L) 12/29/2018   BILITOT 0.3 12/29/2018   ALKPHOS 60 12/29/2018   AST 55 (H) 12/29/2018   ALT 158 (H) 12/29/2018  .   Total Time in preparing paper work, data evaluation and todays exam - 80 minutes  Lala Lund M.D on 12/30/2018 at 9:44 AM  [facility]   Office  [phone]

## 2018-12-30 NOTE — TOC Initial Note (Signed)
Transition of Care [facility]) - Initial/Assessment Note    Patient Details  Name: Nancy Weeks MRN: [id number] Date of Birth: 10/01/1995  Transition of Care (TOC) CM/SW Contact:    Joaquin Courts, RN Phone Number: 12/30/2018, 10:53 AM  Clinical Narrative:    CM spoke with patient who reports she is uninsured and has no pcp and is unable to afford the cost of her prescriptions. Patient set up with pcp and information placed on AVS. Patient set up with match letter, which was faxed to unit and bedside RN to provide letter to patient with printed prescriptions.                 Expected Discharge Plan: Home/Self Care Barriers to Discharge: No Barriers Identified   Patient Goals and CMS Choice        Expected Discharge Plan and Services Expected Discharge Plan: Home/Self Care   Discharge Planning Services: CM Consult   Living arrangements for the past 2 months: Single Family Home Expected Discharge Date: 12/30/18               DME Arranged: N/A DME Agency: NA       HH Arranged: NA [place] Agency: NA        Prior Living Arrangements/Services Living arrangements for the past 2 months: Single Family Home Lives with:: Parents Patient language and need for interpreter reviewed:: Yes Do you feel safe going back to the place where you live?: Yes      Need for Family Participation in Patient Care: Yes (Comment) Care giver support system in place?: Yes (comment)   Criminal Activity/Legal Involvement Pertinent to Current Situation/Hospitalization: No - Comment as needed  Activities of Daily Living Home Assistive Devices/Equipment: None ADL Screening (condition at time of admission) Patient's cognitive ability adequate to safely complete daily activities?: Yes Is the patient deaf or have difficulty hearing?: No Does the patient have difficulty seeing, even when wearing glasses/contacts?: No Does the patient have difficulty concentrating, remembering, or making  decisions?: No Patient able to express need for assistance with ADLs?: No Does the patient have difficulty dressing or bathing?: No Independently performs ADLs?: Yes (appropriate for developmental age) Walks in Home: Independent Does the patient have difficulty walking or climbing stairs?: No Weakness of Legs: None Weakness of Arms/Hands: None  Permission Sought/Granted                  Emotional Assessment   Attitude/Demeanor/Rapport: Engaged Affect (typically observed): Accepting Orientation: : Oriented to Place, Oriented to  Time, Oriented to Situation, Oriented to Self   Psych Involvement: No (comment)  Admission diagnosis:  RESPIRATORY DIFFICULTY COVID 19 VIRUS INFECTION Patient Active Problem List   Diagnosis Date Noted  . Acute respiratory failure with hypoxia ([place]) 12/25/2018  . Pneumonia due to COVID-19 virus 12/25/2018  . SIRS (systemic inflammatory response syndrome) ([place]) 12/25/2018  . Obesity, Class III, BMI 40-49.9 (morbid obesity) ([place]) 12/25/2018   PCP:  System, Pcp Not In Pharmacy:   [id number] [place], [place] [address] [place] [address] Phone: [phone] Fax: [phone]     Social Determinants of Health (SDOH) Interventions    Readmission Risk Interventions No flowsheet data found.

## 2020-08-30 ENCOUNTER — Telehealth: Payer: Self-pay | Admitting: Family Medicine

## 2020-08-30 NOTE — Telephone Encounter (Signed)
Pt states that she called earlier this morning trying to make an appointment on Friday for [facility] because she is coming for Imm that day. Pt states that she was told someone would call her back but she missed the call and there was no message left.

## 2020-08-30 NOTE — Telephone Encounter (Signed)
Patient called to possibly add another thing to her appointment for 09/02/20. Saw that it was for immunizations and she is interested in Mirena since her PCP doesn't have it readily available. Told her I'd leave a note for the provider to give her a call back. TY!

## 2020-08-30 NOTE — Telephone Encounter (Signed)
Phone to pt. Discussed details of different types of clinic and would need to schedule physical with provider. Pt states she has a PCP that she has been seeing for a couple of years now and has had physical with them; she will call her PCP and inquire about a BC method she could use in the interim until her Mirena is delivered to PCP. Pt plans to keep imm appt this Friday.

## 2020-08-30 NOTE — Telephone Encounter (Signed)
Phone call to pt. Left message that RN with [facility] is returning phone call. Left detailed message about different clinic areas for immunizations (RN only) and Mirena (seeing Dr/Provider). Also, we would need to schedule a physical with the provider before Mirena is placed. Please call us back to discuss details and appointments, [phone].

## 2020-08-30 NOTE — Telephone Encounter (Signed)
Pt returned phone call and left message with clerical (see separate phone note dated 08/30/20).   Phone call back to pt. Discussed details of different types of clinic and would need to schedule physical with provider. Pt states she has a PCP that she has been seeing for a couple of years now and has had physical with them; she will call her PCP and inquire about a BC method she could use in the interim until her Mirena is delivered to PCP. Pt plans to keep imm appt this Friday.

## 2020-09-02 ENCOUNTER — Other Ambulatory Visit: Payer: Self-pay

## 2020-09-02 ENCOUNTER — Ambulatory Visit (LOCAL_COMMUNITY_HEALTH_CENTER): Payer: Self-pay

## 2020-09-02 DIAGNOSIS — Z7185 Encounter for immunization safety counseling: Secondary | ICD-10-CM

## 2020-09-02 NOTE — Progress Notes (Signed)
Pt presents to nurse clinic for review of her immunization records. No vaccines needed at this time. Up to date copy given to pt.

## 2020-10-11 ENCOUNTER — Ambulatory Visit (LOCAL_COMMUNITY_HEALTH_CENTER): Payer: Self-pay

## 2020-10-11 ENCOUNTER — Other Ambulatory Visit: Payer: Self-pay

## 2020-10-11 DIAGNOSIS — Z111 Encounter for screening for respiratory tuberculosis: Secondary | ICD-10-CM

## 2020-10-14 ENCOUNTER — Ambulatory Visit (LOCAL_COMMUNITY_HEALTH_CENTER): Payer: Self-pay

## 2020-10-14 DIAGNOSIS — Z111 Encounter for screening for respiratory tuberculosis: Secondary | ICD-10-CM

## 2020-10-14 LAB — TB SKIN TEST
Induration: 0 mm
TB Skin Test: NEGATIVE

## 2020-10-17 ENCOUNTER — Other Ambulatory Visit: Payer: Self-pay

## 2020-10-17 ENCOUNTER — Encounter: Payer: Self-pay | Admitting: Family Medicine

## 2020-10-17 ENCOUNTER — Ambulatory Visit (LOCAL_COMMUNITY_HEALTH_CENTER): Payer: Self-pay | Admitting: Family Medicine

## 2020-10-17 VITALS — BP 117/80 | Wt 238.8 lb

## 2020-10-17 DIAGNOSIS — Z3009 Encounter for other general counseling and advice on contraception: Secondary | ICD-10-CM

## 2020-10-17 DIAGNOSIS — Z0000 Encounter for general adult medical examination without abnormal findings: Secondary | ICD-10-CM

## 2020-10-17 NOTE — Progress Notes (Signed)
Providers orders completed 

## 2020-10-17 NOTE — Progress Notes (Signed)
Patient had nexplanon inserted at [facility] facilities on11/15/18, had it taken out around 2019, per patient, unable to recall exact date.

## 2020-10-19 NOTE — Progress Notes (Signed)
Family Planning Visit- Repeat Yearly Visit  Subjective:  Nancy Weeks is a 25 y.o. No obstetric history on file.  being seen today for an annual wellness visit and to discuss contraception options.   The patient is currently using None for pregnancy prevention. Patient does not want a pregnancy in the next year. Patient has the following medical problems: has Acute respiratory failure with hypoxia (HCC); Pneumonia due to COVID-19 virus; SIRS (systemic inflammatory response syndrome) (HCC); and Obesity, Class III, BMI 40-49.9 (morbid obesity) (HCC) on their problem list.  Chief Complaint  Patient presents with   Annual Exam    contraception   Contraception    Patient reports here for physical and IUD   Patient denies need for screening for STI's    See flowsheet for other program required questions.   Body mass index is 43.68 kg/m. - Patient is eligible for diabetes screening based on BMI and age >66?  not applicable HA1C ordered? not applicable  Patient reports 1 of partners in last year. Desires STI screening?  No - declines    Has patient been screened once for HCV in the past?  No  No results found for: HCVAB  Does the patient have current of drug use, have a partner with drug use, and/or has been incarcerated since last result? No  If yes-- Screen for HCV through [facility]   Does the patient meet criteria for HBV testing? No  Criteria:  -Household, sexual or needle sharing contact with HBV -History of drug use -HIV positive -Those with known Hep C   Health Maintenance Due  Topic Date Due   COVID-19 Vaccine (1) Never done   HPV VACCINES (1 - Risk 3-dose series) Never done   Hepatitis C Screening  Never done   PAP-Cervical Cytology Screening  Never done   PAP SMEAR-Modifier  Never done    Review of Systems  Constitutional:  Negative for chills, fever, malaise/fatigue and weight loss.  HENT:  Negative for congestion, hearing loss and sore throat.    Eyes:  Positive for blurred vision. Negative for double vision and photophobia.       Pt reports needs glasses, reports cant see signs far off.    Respiratory:  Negative for shortness of breath.   Cardiovascular:  Negative for chest pain.  Gastrointestinal:  Positive for nausea. Negative for abdominal pain, blood in stool, constipation, diarrhea, heartburn and vomiting.       Reports nausea some mornings.    Genitourinary:  Negative for dysuria and frequency.  Musculoskeletal:  Negative for back pain, joint pain and neck pain.  Skin:  Negative for itching and rash.  Neurological:  Negative for dizziness, weakness and headaches.  Endo/Heme/Allergies:  Does not bruise/bleed easily.  Psychiatric/Behavioral:  Negative for depression, substance abuse and suicidal ideas.    The following portions of the patient's history were reviewed and updated as appropriate: allergies, current medications, past family history, past medical history, past social history, past surgical history and problem list. Problem list updated.  Objective:   Vitals:   10/17/20 1314  BP: 117/80  Weight: 238 lb 12.8 oz (108.3 kg)    Physical Exam Vitals and nursing note reviewed.  Constitutional:      Appearance: Normal appearance.  HENT:     Head: Normocephalic and atraumatic.     Mouth/Throat:     Mouth: Mucous membranes are moist.     Pharynx: No oropharyngeal exudate or posterior oropharyngeal erythema.  Eyes:  General: No scleral icterus. Cardiovascular:     Rate and Rhythm: Normal rate.     Pulses: Normal pulses.  Pulmonary:     Effort: Pulmonary effort is normal.  Abdominal:     General: Abdomen is flat. Bowel sounds are normal.     Palpations: Abdomen is soft.  Genitourinary:    Comments: Deferred  Musculoskeletal:        General: Normal range of motion.  Skin:    General: Skin is warm and dry.  Neurological:     General: No focal deficit present.     Mental Status: She is alert and  oriented to person, place, and time.  Psychiatric:        Behavior: Behavior normal.      Assessment and Plan:  Nancy Weeks is a 25 y.o. female No obstetric history on file. presenting to the [facility] Department for an yearly wellness and contraception visit    1. Routine general medical examination at a health care facility Well woman exam  Pap reports pap was recently done at [facility] clinic, ROI sent for records.    2. Family planning counseling Contraception counseling: Reviewed all forms of birth control options in the tiered based approach. available including abstinence; over the counter/barrier methods; hormonal contraceptive medication including pill, patch, ring, injection,contraceptive implant, ECP; hormonal and nonhormonal IUDs; permanent sterilization options including vasectomy and the various tubal sterilization modalities. Risks, benefits, and typical effectiveness rates were reviewed.  Questions were answered.  Written information was also given to the patient to review.  Patient desires IUD, this was not prescribed today for patient d/t last sex and chance of pregnancy. She will follow up in  2 weeks for return to clinic  for PT and  IUD insertion.  She was told to call with any further questions, or with any concerns about this method of contraception.  Emphasized use of condoms 100% of the time for STI prevention.  Patient was offered ECP based on last sex of 10/16/20. ECP was not accepted by the patient. ECP counseling was not given.   Discussed with patient about using Plan B as a form of BC and options of BCM.  Patient is interested in having IUD.  Discussed with patient that she needs to not have sex for 2 weeks and RTC for PT and IUD placement.       No follow-ups on file.  Future Appointments  Date Time Provider Department Center  11/03/2020  3:30 PM [facility] PROVIDER [facility] None    Wendi Snipes, FNP

## 2020-11-03 ENCOUNTER — Ambulatory Visit: Payer: Self-pay

## 2020-11-08 ENCOUNTER — Ambulatory Visit (LOCAL_COMMUNITY_HEALTH_CENTER): Payer: Self-pay | Admitting: Advanced Practice Midwife

## 2020-11-08 ENCOUNTER — Encounter: Payer: Self-pay | Admitting: Advanced Practice Midwife

## 2020-11-08 ENCOUNTER — Other Ambulatory Visit: Payer: Self-pay

## 2020-11-08 VITALS — BP 120/72 | Ht 62.0 in | Wt 285.0 lb

## 2020-11-08 DIAGNOSIS — Z3009 Encounter for other general counseling and advice on contraception: Secondary | ICD-10-CM

## 2020-11-08 DIAGNOSIS — Z30017 Encounter for initial prescription of implantable subdermal contraceptive: Secondary | ICD-10-CM

## 2020-11-08 MED ORDER — ETONOGESTREL 68 MG ~~LOC~~ IMPL
68.0000 mg | DRUG_IMPLANT | Freq: Once | SUBCUTANEOUS | Status: AC
  Administered 2020-11-08: 68 mg via SUBCUTANEOUS

## 2020-11-08 NOTE — Progress Notes (Signed)
ROI faxed to [facility] for 01/19/2020 Pap Smear results. Tawny Hopping, RN

## 2020-11-08 NOTE — Progress Notes (Addendum)
Here today for Nexplanon Insertion. Last PE was here 10/17/2020. Last Pap Smear was "recently at [facility]" ROI faxed to [facility] for records. Nexplanon consult completed and consents forms signed. Patients weight taken x2 today due to weight charted as 238.0 lbs at 10/17/20 appt. Weight today was 285.0 lbs x2. Tawny Hopping, RN

## 2020-11-08 NOTE — Progress Notes (Signed)
Contraception/Family Planning VISIT ENCOUNTER NOTE  Subjective:   Nancy Weeks is a [employer] nullip exsmoker female here for reproductive life counseling.  Desires Nexplanon for BCM.  Reports she does not want a pregnancy in the next year. Denies abnormal vaginal bleeding, discharge, pelvic pain, problems with intercourse or other gynecologic concerns. LMP 11/06/20. Last sex 10/29/20 without condom; with current partner x 5 years; 1 partner in last 3 mo; states took Plan B on 10/29/20 afterwards. Last PE 10/17/20. Last cig 08/2020. Last vaped 09/2020. Last ETOH 11/06/20 (1 Margarita) 1-2x/mo. Living with her mom and stepdad. Not working. Wants Nexplanon insertion.   Gynecologic History Patient's last menstrual period was 11/06/2020 (exact date). Contraception:  none  Health Maintenance Due  Topic Date Due   COVID-19 Vaccine (1) Never done   HPV VACCINES (1 - Risk 3-dose series) Never done   Hepatitis C Screening  Never done   PAP-Cervical Cytology Screening  Never done   PAP SMEAR-Modifier  Never done     The following portions of the patient's history were reviewed and updated as appropriate: allergies, current medications, past family history, past medical history, past social history, past surgical history and problem list.  Review of Systems Pertinent items are noted in HPI.   Objective:  BP 120/72   Ht 5\' 2"  (1.575 m)   Wt 285 lb (129.3 kg)   LMP 11/06/2020 (Exact Date)   BMI 52.13 kg/m  Gen: well appearing, NAD HEENT: no scleral icterus CV: RR Lung: Normal WOB Ext: warm well perfused     Assessment and Plan:   Contraception counseling: Reviewed all forms of birth control options in the tiered based approach. available including abstinence; over the counter/barrier methods; hormonal contraceptive medication including pill, patch, ring, injection,contraceptive implant, ECP; hormonal and nonhormonal IUDs; permanent sterilization options including vasectomy and the various  tubal sterilization modalities. Risks, benefits, and typical effectiveness rates were reviewed.  Questions were answered.  Written information was also given to the patient to review.  Patient desires Nexplanon insertion this was prescribed for patient. She will follow up in  1 year/prn for surveillance.  She was told to call with any further questions, or with any concerns about this method of contraception.  Emphasized use of condoms 100% of the time for STI prevention.  Patient was offered ECP. ECP was not accepted by the patient. ECP counseling was not given - see RN documentation  1. Family planning ROI for pap at [facility] again (was also sent on 10/17/20). Pt thinks pap was done there 01/19/20 and neg  - etonogestrel (NEXPLANON) implant 68 mg  2. Encounter for initial prescription of implantable subdermal contraceptive Nexplanon Insertion Procedure Patient identified, informed consent performed, consent signed.   Patient does understand that irregular bleeding is a very common side effect of this medication. She was advised to have backup contraception after placement. Patient was determined to meet WHO criteria for not being pregnant. Appropriate time out taken.  The insertion site was identified 8-10 cm (3-4 inches) from the medial epicondyle of the humerus and 3-5 cm (1.25-2 inches) posterior to (below) the sulcus (groove) between the biceps and triceps muscles of the patient's left arm and marked.  Patient was prepped with alcohol swab and then injected with 3 ml of 1% lidocaine.  Arm was prepped with chlorhexidene, Nexplanon removed from packaging,  Device confirmed in needle, then inserted full length of needle and withdrawn per handbook instructions. Nexplanon was able to palpated in the patient's arm;  patient palpated the insert herself. There was minimal blood loss.  Patient insertion site covered with guaze and a pressure bandage to reduce any bruising.  The patient tolerated the  procedure well and was given post procedure instructions.   Nexplanon:   Counseled patient to take OTC analgesic starting as soon as lidocaine starts to wear off and take regularly for at least 48 hr to decrease discomfort.  Specifically to take with food or milk to decrease stomach upset and for IB 600 mg (3 tablets) every 6 hrs; IB 800 mg (4 tablets) every 8 hrs; or Aleve 2 tablets every 12 hrs.      Please refer to After Visit Summary for other counseling recommendations.   Return for yearly physical exam.  Alberteen Spindle, CNM [facility] DEPARTMENT

## 2020-12-03 IMAGING — DX CHEST  1 VIEW
1 series · 1 of 1 positions shown · non-contrast
Comparison: None.

CLINICAL DATA: Shortness of breath, fevers for 3 days, [id number]
positive contact

EXAM:
CHEST  1 VIEW

[chest ap]
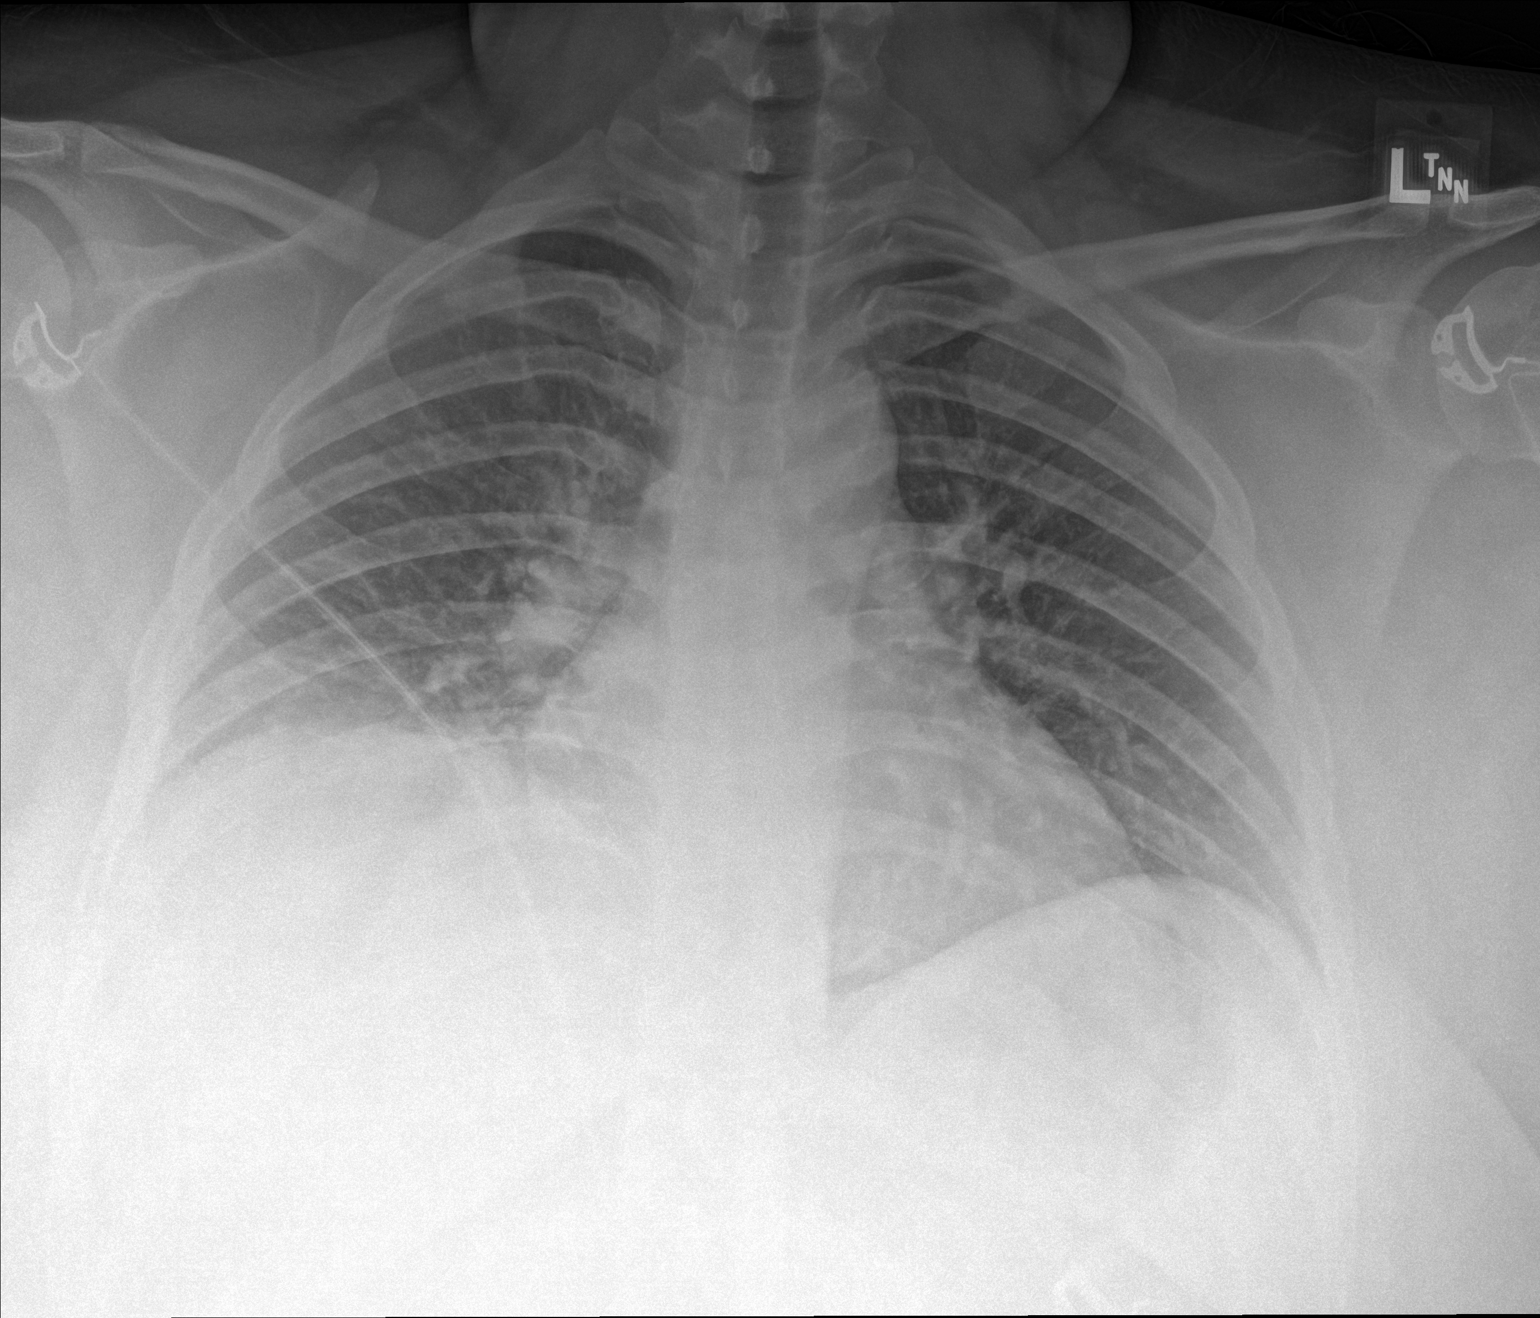

[1 of 1 positions shown; findings below may reference images not displayed]

FINDINGS: There is elevation the right hemidiaphragm with adjacent hazy
opacity likely reflective of atelectasis though can not exclude
underlying consolidation. The pulmonary vascularity is normal. No
pneumothorax. No visible effusion. Cardiomediastinal contours are
unremarkable for portable seated technique. No acute osseous or soft
tissue abnormality
IMPRESSION: Elevation of the right hemidiaphragm with adjacent hazy opacity
likely reflective of atelectasis though can not exclude underlying
consolidation/infection.

## 2020-12-05 IMAGING — DX PORTABLE CHEST - 1 VIEW
1 series · 1 of 1 positions shown · non-contrast
Comparison: 12/23/2018

CLINICAL DATA: Cough, shortness of breath, evaluate for pneumonia,
positive [id number]

EXAM:
PORTABLE CHEST 1 VIEW

[chest ap]
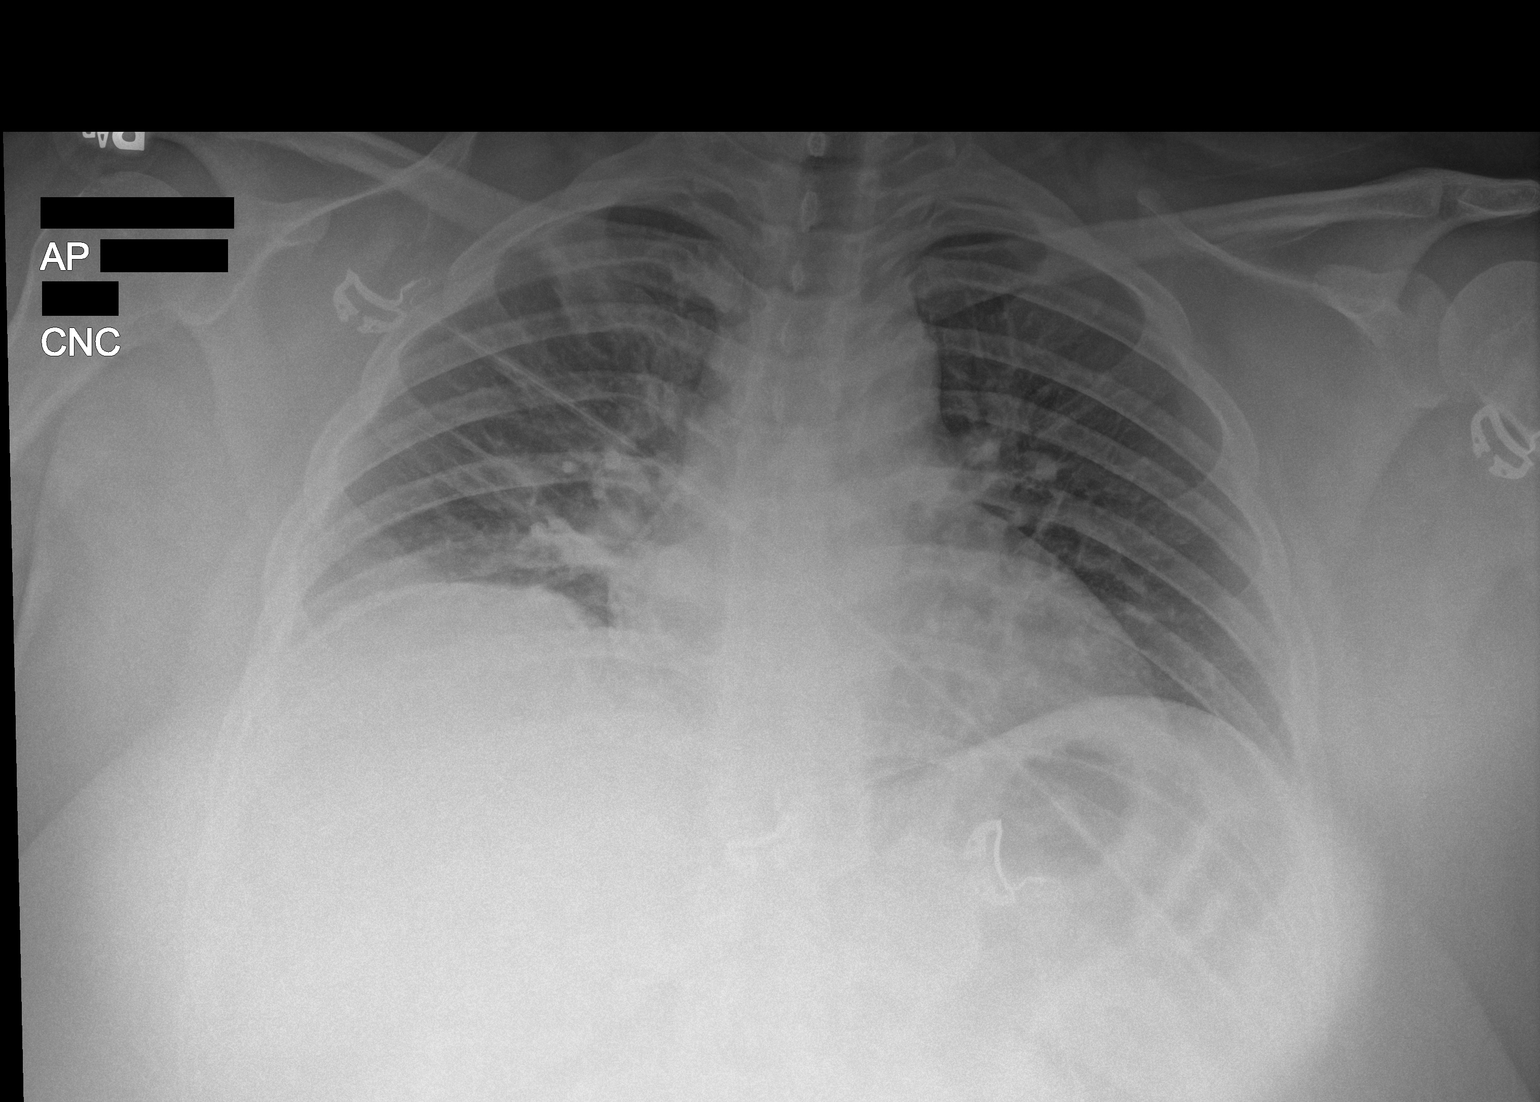

[1 of 1 positions shown; findings below may reference images not displayed]

FINDINGS: Low volume AP portable examination, not significantly changed
compared to prior examination. No acute appearing airspace opacity.
IMPRESSION: Low volume AP portable examination, not significantly changed
compared to prior examination. No acute appearing airspace opacity.

## 2021-10-17 ENCOUNTER — Other Ambulatory Visit: Payer: Self-pay | Admitting: Family Medicine

## 2021-10-17 DIAGNOSIS — N644 Mastodynia: Secondary | ICD-10-CM

## 2021-10-23 ENCOUNTER — Ambulatory Visit
Admission: RE | Admit: 2021-10-23 | Discharge: 2021-10-23 | Disposition: A | Discharge status: Home / Self Care | Payer: MEDICAID | Source: Ambulatory Visit | Attending: Family Medicine | Admitting: Family Medicine

## 2021-10-23 DIAGNOSIS — N644 Mastodynia: Secondary | ICD-10-CM | POA: Diagnosis present

## 2023-05-14 ENCOUNTER — Ambulatory Visit: Payer: 59 | Admitting: Pediatrics

## 2023-05-14 ENCOUNTER — Encounter: Payer: Self-pay | Admitting: Pediatrics

## 2023-05-14 VITALS — BP 128/76 | HR 67 | Temp 98.9°F | Resp 16 | Ht 63.23 in | Wt 282.2 lb

## 2023-05-14 DIAGNOSIS — Z1330 Encounter for screening examination for mental health and behavioral disorders, unspecified: Secondary | ICD-10-CM

## 2023-05-14 DIAGNOSIS — Z308 Encounter for other contraceptive management: Secondary | ICD-10-CM

## 2023-05-14 DIAGNOSIS — Z23 Encounter for immunization: Secondary | ICD-10-CM

## 2023-05-14 DIAGNOSIS — Z13220 Encounter for screening for lipoid disorders: Secondary | ICD-10-CM

## 2023-05-14 DIAGNOSIS — Z131 Encounter for screening for diabetes mellitus: Secondary | ICD-10-CM | POA: Diagnosis not present

## 2023-05-14 DIAGNOSIS — R5383 Other fatigue: Secondary | ICD-10-CM | POA: Diagnosis not present

## 2023-05-14 DIAGNOSIS — Z6841 Body Mass Index (BMI) 40.0 and over, adult: Secondary | ICD-10-CM | POA: Diagnosis not present

## 2023-05-14 DIAGNOSIS — Z7689 Persons encountering health services in other specified circumstances: Secondary | ICD-10-CM

## 2023-05-14 LAB — CBC
Hematocrit: 41.1 % (ref 34.0–46.6)
Hemoglobin: 14 g/dL (ref 11.1–15.9)
MCH: 29 pg (ref 26.6–33.0)
MCHC: 34.1 g/dL (ref 31.5–35.7)
MCV: 85 fL (ref 79–97)
Platelets: 307 10*3/uL (ref 150–450)
RBC: 4.82 x10E6/uL (ref 3.77–5.28)
RDW: 13.6 % (ref 11.7–15.4)
WBC: 7.5 10*3/uL (ref 3.4–10.8)

## 2023-05-14 NOTE — Patient Instructions (Addendum)
 Good to meet you! Welcome to [facility]!  As your primary care doctor, I look forward to working with you to help you reach your health goals.  Please be aware of a couple of logistical items: - If you message me on mychart, it may take me 1-2 business days to get back to you. This is for non-urgent messaging.  - If you require urgent clinical attention, please call the clinic or present to urgent care/emergency room - If you have labs, I typically will send a message about them in 1-2 business days. - I am not here on Mondays, otherwise will be available from Tuesday-Friday during 8a-5pm.   Your Birth Control Choices: Also more info on a great web-site [email or web address]   Method How well does it work? How to Use Pros Cons  The Implant Nexplanon    > 99% A health care provider places it under the skin of the upper arm It must be removed by a health care provider Long lasting (up to 3 years) No pill to take daily Often decreases cramps Can be used while breastfeeding You can become pregnant right after it is removed Can cause irregular bleeding After 1 year, you may have no period at all Does not protect against human immunodeficiency virus (HIV) or other sexually transmitted infections (STIs)  Progestin IUD Mirena, Skyla   > 99% Must be placed in uterus by a health care provider Usually removed by a health care provider Mirena may be left in place up to 7 years. Velma may be left in place up to 3 years No pill to take daily May improve period cramps and bleeding Can be used while breastfeeding You can become pregnant right after it is removed May cause lighter periods, spotting, or no period at all Rarely, uterus is injured during placement Does not protect against HIV or other STIs   Copper IUD ParaGard   > 99% Must be placed in uterus by a health care provider Usually removed by a health care provider May be left in place for up to 12 years No pill to  take daily Can be used while breastfeeding You can become pregnant right after it is removed May cause more cramps and heavier periods May cause spotting between periods Rarely, uterus is injured during placement Does not protect against HIV or other STIs  The Shot Depo-Provera   97-99% Get a shot every 3 months Each shot works for 12 weeks Private Usually decreases periods Helps prevent cancer of the uterus No pill to take daily Can be used while breastfeeding May cause spotting, no period, weight gain, depression, hair or skin changes, change in sex drive May cause delay in getting pregnant after you stop the shots Side effects may last up to 6 months after you stop the shots Does not protect against HIV or other STIs  The Pill   92-99% Must take the pill daily Can make periods more regular and less painful Can improve PMS symptoms Can improve acne Helps prevent cancer of the ovaries You can become pregnant right after stopping the pills May cause nausea, weight gain, headaches, change in sex drive - some of these can be relieved by changing to a new brand May cause spotting the first 1-2 months Does not protect against HIV or other STIs  Progestin-Only Pills   92-99% Must take the pill daily Can be used while breastfeeding You can become pregnant right after stopping the pills Often causes spotting, which may last  for many months May cause depression, hair or skin changes, change in sex drive Does not protect against HIV or other STIs  The Patch Ortho Evra  92-99% Apply a new patch once a week for three weeks No patch in week 4 Can make periods more regular and less painful No pill to take daily You can become pregnant right after stopping patch Can irritate skin under the patch May cause spotting the first 1-2 months Does not protect against HIV or other STIs  The Ring Nuvaring   92-99% Insert a small ring into the vagina Change ring each month One size fits  all Private Does not require spermicide Can make periods more regular and less painful No pill to take daily You can become pregnant right after stopping the ring Can increase vaginal discharge May cause spotting the first 1-2 months of use Does not protect against HIV or other STIs  Female Condom   85-98% Use a new condom each time you have sex Use a polyurethane condom if allergic to latex  Can buy at many stores Can put on as part of sex play/foreplay Can help prevent early ejaculation Can be used for oral, vaginal, and anal sex Protects against HIV and other STIs Can be used while breastfeeding Can decrease sensation Can cause loss of erection Can break or slip off  Female/ Internal Condom   79-95% Use a new condom each time you have sex Use extra lubrication as needed  Can buy at many stores Can put in as part of sex play/foreplay Can be used for anal and vaginal sex May increase pleasure when used for vaginal sex Good for people with latex allergy Protects against HIV and other STIs Can be used while breastfeeding Can decrease sensation May be noisy May be hard to insert May slip out of place during sex  Withdrawal Pull-out  73-96% Pull penis out of vagina before ejaculation (that is, before coming) Costs nothing Can be used while breastfeeding  Less pleasure for some Does not work if penis is not pulled out in time Does not protect against HIV or other STIs Must interrupt sex  Diaphragm  84-94% Must be used each time you have sex Must be used with spermicide A health care provider will fit you and show you how to use it Can last several years Costs very little to use May protect against some infections, but not HIV Can be used while breastfeeding Using spermicide may raise the risk of getting HIV Should not be used with vaginal bleeding or infection Raises risk of bladder infection  Rhythm Natural Family Planning, Fertility Awareness   75-88% Predict  fertile days by: taking temperature daily, checking vaginal mucus for changes, and/or keeping a record of your periods It works best if you use more than one of these Avoid sex or use condoms/spermicide during fertile days Costs little Can be used while breastfeeding Can help with avoiding or trying to become pregnant  Must use another method during fertile days Does not work well if your periods are irregular Many things to remember with this method Does not protect against HIV or STIs  Spermicide Cream, gel, sponge,  foam, inserts, film   71-85% Insert spermicide each time you have sex  Can buy at many stores Can insert as part of sex play/foreplay Comes in many forms: cream, gel, sponge, foam, inserts, film Can be used while breastfeeding May raise the risk of getting HIV  May irritate vagina, penis Cream,  gel, and foam can be messy  Emergency Contraception Pills Progestin EC (Plan B One-Step, Next ChoiceT and others) and ulipristal acetate (ella)    0-94% Ulipristal EC works better than progestin EC if you weigh more than 155 pounds (BMI > 26).  Ulipristal EC works better than progestin EC in the 3-5 days after sex Works best the sooner you take it after unprotected sex You can take EC up to 5 days after unprotected sex If pack contains 2 pills, take both together You should start a birth control method right after using EC to avoid pregnancy Can be used while breastfeeding Available at pharmacies, health centers or health care providers: call ahead to see if they have it People of any age can get some brands without a prescription  May cause stomach upset or nausea Your next period may come early or late May cause spotting Does not protect against HIV or other STIs If you are under age 43 you may need a prescription for some brands Ulipristal requires a prescription May cost a lot

## 2023-05-14 NOTE — Progress Notes (Signed)
 Establish Care Note  BP (!) 130/90 (BP Location: Left Arm, Patient Position: Sitting, Cuff Size: Large)   Pulse 67   Temp 98.9 F (37.2 C) (Oral)   Resp 16   Ht 5' 3.23 (1.606 m)   Wt 282 lb 3.2 oz (128 kg)   LMP  (LMP Unknown)   SpO2 98%   BMI 49.63 kg/m    Subjective:    Patient ID: Nancy Weeks, female    DOB: Oct 29, 1995, 28 y.o.   MRN: [id number]  HPI: Nancy Weeks is a 28 y.o. female  Chief Complaint  Patient presents with  . Establish Care    Previous PCP at [facility]  . Nexplanon     About to expire- 08/2023-09/2023. Would like to consider other options. Pills have caused issue. Feels she is always bleeding     Establishing care, the following was discussed today:  Discussed the use of AI scribe software for clinical note transcription with the patient, who gave verbal consent to proceed.  History of Present Illness   The patient, a nursing professional, presents with concerns about her current birth control method, the Nexplanon  implant, which is due to expire in a few months. This is her second Nexplanon  implant, having had the first one inserted around the age of 38. She reports a history of daily spotting and periods, which have been ongoing for approximately a month. Prior to the Nexplanon , the patient had regular menstrual cycles. She has tried birth control pills in the past, but these caused significant nausea.  The patient also reports a persistent feeling of tiredness and frequent episodes of anger, which she attributes to hormonal changes related to her birth control. She denies any previous diagnosis of depression or anxiety.  In addition to her contraceptive concerns, the patient mentions a history of weight gain and previous attempts at weight loss through medication. She has tried Victoza in the past, but discontinued due to side effects. She also reports a history of iron deficiency and recent symptoms of dizziness  and feeling faint, which she suspects may be related to low iron levels.  The patient also reports a discomfort in her breast, which was previously evaluated with an ultrasound and attributed to fibrous tissue. The discomfort is not constant, but is noticeable when she removes her bra or touches the area.  Lastly, the patient mentions a change in her sleep patterns due to working night shifts, which she feels may be contributing to her overall sense of tiredness.        Current Outpatient Medications on File Prior to Visit  Medication Sig Dispense Refill  . metformin  (FORTAMET ) 500 MG (OSM) 24 hr tablet Take 500 mg by mouth daily with breakfast.     No current facility-administered medications on file prior to visit.    #HM Will review HM records and updated as needed.   Relevant past medical, surgical, family and social history reviewed and updated as indicated. Interim medical history since our last visit reviewed. Allergies and medications reviewed and updated.  ROS per HPI unless specifically indicated above     Objective:    BP (!) 130/90 (BP Location: Left Arm, Patient Position: Sitting, Cuff Size: Large)   Pulse 67   Temp 98.9 F (37.2 C) (Oral)   Resp 16   Ht 5' 3.23 (1.606 m)   Wt 282 lb 3.2 oz (128 kg)   LMP  (LMP Unknown)   SpO2 98%   BMI 49.63 kg/m  Wt Readings from Last 3 Encounters:  05/14/23 282 lb 3.2 oz (128 kg)  11/08/20 285 lb (129.3 kg)  10/17/20 238 lb 12.8 oz (108.3 kg)     Physical Exam Constitutional:      Appearance: Normal appearance.  HENT:     Head: Normocephalic and atraumatic.  Eyes:     Pupils: Pupils are equal, round, and reactive to light.  Cardiovascular:     Rate and Rhythm: Normal rate and regular rhythm.     Pulses: Normal pulses.     Heart sounds: Normal heart sounds.  Pulmonary:     Effort: Pulmonary effort is normal.     Breath sounds: Normal breath sounds.  Musculoskeletal:        General: Normal range of motion.      Cervical back: Normal range of motion.  Skin:    General: Skin is warm and dry.     Capillary Refill: Capillary refill takes less than 2 seconds.  Neurological:     General: No focal deficit present.     Mental Status: She is alert. Mental status is at baseline.  Psychiatric:        Mood and Affect: Mood normal.        Behavior: Behavior normal.        05/14/2023    2:07 PM 10/17/2020    1:28 PM  Depression screen PHQ 2/9  Decreased Interest 1 1  Down, Depressed, Hopeless 1 1  PHQ - 2 Score 2 2  Altered sleeping 2 0  Tired, decreased energy 2 1  Change in appetite 1 1  Feeling bad or failure about yourself  0 1  Trouble concentrating 1 1  Moving slowly or fidgety/restless 0 0  Suicidal thoughts 0 0  PHQ-9 Score 8 6  Difficult doing work/chores Not difficult at all         05/14/2023    2:09 PM  GAD 7 : Generalized Anxiety Score  Nervous, Anxious, on Edge 0  Control/stop worrying 0  Worry too much - different things 0  Trouble relaxing 1  Restless 0  Easily annoyed or irritable 1  Afraid - awful might happen 0  Total GAD 7 Score 2  Anxiety Difficulty Not difficult at all       Assessment & Plan:  Assessment & Plan   Other fatigue Reports feeling tired and lightheaded, with a history of low iron levels. Concern for possible iron deficiency due to daily spotting. -Order CBC, CMP, iron studies, and thyroid  function tests. -Consider iron supplementation if levels are low. -     CBC -     TSH -     Iron, TIBC and Ferritin Panel -     Vitamin B12 -     Folate  BMI 45.0-49.9, adult (HCC) Plan on above labs. Will then check coverage for wegovy  vs other GLP1 pending results.   Encounter to establish care Reviewed available patient record including history, medications, problem list. HM updated as able. Will review and/or request outside records (if applicable) and will fill remaining HM gaps as needed at follow up visit.   Encounter for behavioral health  screening As part of their intake evaluation, the patient was screened for depression, anxiety.  PHQ9 SCORE 8, GAD7 SCORE 2. Screening results negative for tested conditions. CTM  Diabetes mellitus screening -     Hemoglobin A1c  Lipid screening -     Lipid panel  Encounter for other contraceptive management Nexplanon  in place  for approximately 6 years, with daily spotting for the past month. Patient has a history of nausea with oral contraceptive pills. Discussed options including IUD, progesterone-only pill, and injectable contraceptives. -Plan to remove Nexplanon  in February. -Consider progesterone-only pill, ensuring consistent daily use.  Follow up plan: Return in about 4 weeks (around 06/11/2023) for Physical w pap.  Hedy Garro SHAUNNA NETT, MD

## 2023-05-15 ENCOUNTER — Other Ambulatory Visit: Payer: Self-pay | Admitting: Pediatrics

## 2023-05-15 ENCOUNTER — Encounter: Payer: Self-pay | Admitting: Pediatrics

## 2023-05-15 DIAGNOSIS — R7989 Other specified abnormal findings of blood chemistry: Secondary | ICD-10-CM

## 2023-05-15 LAB — IRON,TIBC AND FERRITIN PANEL
Ferritin: 65 ng/mL (ref 15–150)
Iron Saturation: 16 % (ref 15–55)
Iron: 43 ug/dL (ref 27–159)
Total Iron Binding Capacity: 263 ug/dL (ref 250–450)
UIBC: 220 ug/dL (ref 131–425)

## 2023-05-15 LAB — HEMOGLOBIN A1C
Est. average glucose Bld gHb Est-mCnc: 123 mg/dL
Hgb A1c MFr Bld: 5.9 % — ABNORMAL HIGH (ref 4.8–5.6)

## 2023-05-15 LAB — LIPID PANEL
Chol/HDL Ratio: 3.6 {ratio} (ref 0.0–4.4)
Cholesterol, Total: 188 mg/dL (ref 100–199)
HDL: 52 mg/dL (ref >39)
LDL Chol Calc (NIH): 118 mg/dL — ABNORMAL HIGH (ref 0–99)
Triglycerides: 99 mg/dL (ref 0–149)
VLDL Cholesterol Cal: 18 mg/dL (ref 5–40)

## 2023-05-15 LAB — TSH: TSH: 5.17 u[IU]/mL — ABNORMAL HIGH (ref 0.450–4.500)

## 2023-05-15 LAB — FOLATE: Folate: 3.5 ng/mL (ref >3.0)

## 2023-05-15 LAB — VITAMIN B12: Vitamin B-12: 813 pg/mL (ref 232–1245)

## 2023-05-15 NOTE — Progress Notes (Signed)
 Future thyroid  panel ordered.  Nancy Lablanc Jilda Most, MD

## 2023-05-29 ENCOUNTER — Other Ambulatory Visit: Payer: Self-pay | Admitting: Pediatrics

## 2023-05-29 DIAGNOSIS — R7303 Prediabetes: Secondary | ICD-10-CM | POA: Insufficient documentation

## 2023-05-29 DIAGNOSIS — Z6841 Body Mass Index (BMI) 40.0 and over, adult: Secondary | ICD-10-CM

## 2023-05-29 MED ORDER — SEMAGLUTIDE-WEIGHT MANAGEMENT 0.25 MG/0.5ML ~~LOC~~ SOAJ: 0.2500 mg | SUBCUTANEOUS | 0 refills | Status: DC

## 2023-05-29 NOTE — Progress Notes (Signed)
Clinical coverage for weight loss GLP's  Medication being dispensed is Wegovy 3 mL/28 day. Titration doses are 2 mL/28 days.  [x]  Product being prescribed is FDA approved for the indication, age, weight (if applicable) and not does not exceed dosing limits per the Prescribing Information per the clinical conditions for use.  [x]  Patient's baseline weight measured within the last 45 days as required by provider before dispensing.  [x]  Patient is new to therapy and One of the following:  [x]  The beneficiary is 27 years of age or over and has ONE of the following:  [x]  A BMI greater than or equal to 30 kg/m2  []  A BMI greater than or equal to 27 kg/m2 with at least one weight-related comorbidity/risk factor/complication (i.e. hypertension, type 2 diabetes, obstructive sleep apnea, cardiovascular disease, dyslipidemia)  If patient has one weight-related comorbidity/risk factor/complication (i.e. hypertension, type 2 diabetes, obstructive sleep apnea, cardiovascular disease, dyslipidemia), please list prediabetes, hyperlipidemia   [x]  The beneficiary will NOT be using the requested agent in combination with another GLP-1 receptor agonist agent AND  [x]  The beneficiary does NOT have any FDA-labeled contraindications to the requested agent, including pregnancy, lactation, history of medullary thyroid cancer or multiple endocrine neoplasia type II.  Last weight recorded  Wt Readings from Last 3 Encounters:  05/14/23 282 lb 3.2 oz (128 kg)  11/08/20 285 lb (129.3 kg)  10/17/20 238 lb 12.8 oz (108.3 kg)   Last BMI recorded Estimated body mass index is 49.63 kg/m as calculated from the following:   Height as of 05/14/23: 5' 3.23" (1.606 m).   Weight as of 05/14/23: 282 lb 3.2 oz (128 kg).   Sending initial dosing for wegovy as recently discussed.  Hanya Guerin Howell Pringle, MD

## 2023-06-07 ENCOUNTER — Encounter: Payer: Self-pay | Admitting: Pediatrics

## 2023-06-07 ENCOUNTER — Ambulatory Visit (INDEPENDENT_AMBULATORY_CARE_PROVIDER_SITE_OTHER): Payer: 59 | Admitting: Pediatrics

## 2023-06-07 DIAGNOSIS — Z309 Encounter for contraceptive management, unspecified: Secondary | ICD-10-CM | POA: Diagnosis not present

## 2023-06-07 DIAGNOSIS — Z30017 Encounter for initial prescription of implantable subdermal contraceptive: Secondary | ICD-10-CM

## 2023-06-07 DIAGNOSIS — Z3046 Encounter for surveillance of implantable subdermal contraceptive: Secondary | ICD-10-CM | POA: Diagnosis not present

## 2023-06-07 LAB — PREGNANCY, URINE: Preg Test, Ur: NEGATIVE

## 2023-06-07 MED ORDER — NORETHINDRONE 0.35 MG PO TABS: 1.0000 | ORAL_TABLET | Freq: Every day | ORAL | 11 refills | Status: DC

## 2023-06-07 NOTE — Progress Notes (Signed)
 Nexplanon  Removal Procedure Note  Nancy Weeks 28 y.o. female presenting for Nexplanon  removal.  Indication: overdue for removal  Preparation:  The risks, benefits, and alternatives to the procedure were discussed in detail and electronic informed consent was obtained. Risks of removal include damage to local structures, bleeding, pain, infection, scarring.   Patient denied allergies to lidocaine or iodine.    Procedure Details:  Distal margin was marked with sterile pen and area cleaned x 3 with alcohol. 0.5 mL of 1% lidocaine with epi was injected at the distal margin of the implant. The area was then cleaned with chlorhexidine x 3.  0.5 cm incision was made with #11 blade. Device gently pushed towards incision until tip visible, used blunt blade tip to remove fibrosis. Implant then presented and was removed with sterile gloves. Implant noted to be fully intact, confirmed to be 4cm.  Gauze applied to removal site and hemostasis easily achieved. Dressing applied.   Complications: none  Post-procedure: standard post procedure care instructions were given to the patient. Patient understands that pregnancy can occur quickly after removal. Alternative contraception method is: micronor . Routine health maintenance exams recommended.  Patient to follow-up as needed.   Tyrail Grandfield SHAUNNA NETT, MD

## 2023-06-07 NOTE — Patient Instructions (Addendum)
  Please call if you develop any of the symptoms below. I sent the minipill to your pharmacy. Please take it at the same time every day.  Within First Week Redness, warmth, or drainage from insertion site Fever (>101 degrees)  At Any Time Feeling pregnant (breast pain, nausea) Positive home pregnancy test  If you develop any of the above warning signs you should be seen by a clinician. You can call us  at [phone], or go to your usual clinician.

## 2023-06-14 ENCOUNTER — Encounter: Payer: 59 | Admitting: Pediatrics

## 2023-06-21 ENCOUNTER — Encounter: Payer: 59 | Admitting: Pediatrics

## 2023-10-11 ENCOUNTER — Ambulatory Visit: Admitting: Nurse Practitioner

## 2023-10-11 ENCOUNTER — Encounter: Payer: Self-pay | Admitting: Nurse Practitioner

## 2023-10-11 VITALS — BP 113/72 | HR 76 | Temp 98.8°F | Ht 63.2 in | Wt 282.6 lb

## 2023-10-11 DIAGNOSIS — M533 Sacrococcygeal disorders, not elsewhere classified: Secondary | ICD-10-CM | POA: Insufficient documentation

## 2023-10-11 MED ORDER — IBUPROFEN 800 MG PO TABS: 800.0000 mg | ORAL_TABLET | Freq: Three times a day (TID) | ORAL | 1 refills | Status: AC | PRN

## 2023-10-11 NOTE — Patient Instructions (Signed)

## 2023-10-11 NOTE — Progress Notes (Signed)
 BP 113/72   Pulse 76   Temp 98.8 F (37.1 C) (Oral)   Ht 5' 3.2 (1.605 m)   Wt 282 lb 9.6 oz (128.2 kg)   SpO2 98%   BMI 49.74 kg/m    Subjective:    Patient ID: Nancy Weeks, female    DOB: 11-22-1995, 28 y.o.   MRN: [id number]  HPI: Nancy Weeks is a 28 y.o. female  Chief Complaint  Patient presents with   Fall   BACK PAIN Had a fall on May 27th and lower back was hurting a lot.  To coccyx area has had most pain.  Landed on this area.  She is feeling better, but is still have some pain when getting up or sitting down.   Duration: weeks Mechanism of injury: fall Location: low back at coccyx Onset: sudden Severity: 6/10 Quality: sharp, aching, and throbbing Frequency: intermittent Radiation: none Aggravating factors: sitting and getting up from sitting Alleviating factors: Ibuprofen  Status: stable Treatments attempted: Ibuprofen  -- needs more 800 MG tablet  Relief with NSAIDs?: moderate Nighttime pain:  sleeping on side Paresthesias / decreased sensation:  no Bowel / bladder incontinence:  no Fevers:  no Dysuria / urinary frequency:  no   Relevant past medical, surgical, family and social history reviewed and updated as indicated. Interim medical history since our last visit reviewed. Allergies and medications reviewed and updated.  Review of Systems  Constitutional:  Negative for activity change, appetite change, diaphoresis, fatigue and fever.  Respiratory:  Negative for cough, chest tightness and shortness of breath.   Cardiovascular:  Negative for chest pain, palpitations and leg swelling.  Gastrointestinal: Negative.   Musculoskeletal:  Positive for arthralgias.  Neurological: Negative.   Psychiatric/Behavioral: Negative.     Per HPI unless specifically indicated above     Objective:    BP 113/72   Pulse 76   Temp 98.8 F (37.1 C) (Oral)   Ht 5' 3.2 (1.605 m)   Wt 282 lb 9.6 oz (128.2 kg)   SpO2 98%   BMI 49.74 kg/m    Wt Readings from Last 3 Encounters:  10/11/23 282 lb 9.6 oz (128.2 kg)  05/14/23 282 lb 3.2 oz (128 kg)  11/08/20 285 lb (129.3 kg)    Physical Exam Vitals and nursing note reviewed.  Constitutional:      General: She is awake. She is not in acute distress.    Appearance: She is well-developed and well-groomed. She is obese. She is not ill-appearing or toxic-appearing.  HENT:     Head: Normocephalic.     Right Ear: Hearing and external ear normal.     Left Ear: Hearing and external ear normal.   Eyes:     General: Lids are normal.        Right eye: No discharge.        Left eye: No discharge.     Conjunctiva/sclera: Conjunctivae normal.     Pupils: Pupils are equal, round, and reactive to light.   Neck:     Thyroid : No thyromegaly.     Vascular: No carotid bruit.   Cardiovascular:     Rate and Rhythm: Normal rate and regular rhythm.     Heart sounds: Normal heart sounds. No murmur heard.    No gallop.  Pulmonary:     Effort: Pulmonary effort is normal. No accessory muscle usage or respiratory distress.     Breath sounds: Normal breath sounds.  Abdominal:     General: Bowel sounds  are normal. There is no distension.     Palpations: Abdomen is soft.     Tenderness: There is no abdominal tenderness.   Musculoskeletal:     Cervical back: Normal range of motion and neck supple.     Lumbar back: Tenderness (to coccyx area) present. No swelling or bony tenderness. Normal range of motion.     Right lower leg: No edema.     Left lower leg: No edema.  Lymphadenopathy:     Cervical: No cervical adenopathy.   Skin:    General: Skin is warm and dry.   Neurological:     Mental Status: She is alert and oriented to person, place, and time.     Deep Tendon Reflexes: Reflexes are normal and symmetric.     Reflex Scores:      Brachioradialis reflexes are 2+ on the right side and 2+ on the left side.      Patellar reflexes are 2+ on the right side and 2+ on the left  side.  Psychiatric:        Attention and Perception: Attention normal.        Mood and Affect: Mood normal.        Speech: Speech normal.        Behavior: Behavior normal. Behavior is cooperative.        Thought Content: Thought content normal.     Results for orders placed or performed in visit on 06/07/23  Pregnancy, urine   Collection Time: 06/07/23  3:52 PM  Result Value Ref Range   Preg Test, Ur Negative Negative      Assessment & Plan:   Problem List Items Addressed This Visit       Other   Pain in the coccyx - Primary   Acute since fall on 09/24/23, but this is improving.  Will send in Ibuprofen  800 MG to take Q8H as needed for pain, she reports this has worked well in past for pain.  Offered imaging, she declines at this time.  Recommend heat and OTC creams to area + sitting on donut until pain improves.      Relevant Medications   ibuprofen  (ADVIL ) 800 MG tablet     Follow up plan: Return for as scheduled July 2nd.

## 2023-10-11 NOTE — Assessment & Plan Note (Signed)
 Acute since fall on 09/24/23, but this is improving.  Will send in Ibuprofen  800 MG to take Q8H as needed for pain, she reports this has worked well in past for pain.  Offered imaging, she declines at this time.  Recommend heat and OTC creams to area + sitting on donut until pain improves.

## 2023-10-30 ENCOUNTER — Encounter: Admitting: Pediatrics

## 2023-12-10 ENCOUNTER — Ambulatory Visit (INDEPENDENT_AMBULATORY_CARE_PROVIDER_SITE_OTHER): Admitting: Pediatrics

## 2023-12-10 ENCOUNTER — Encounter: Payer: Self-pay | Admitting: Pediatrics

## 2023-12-10 ENCOUNTER — Other Ambulatory Visit (HOSPITAL_COMMUNITY)
Admission: RE | Admit: 2023-12-10 | Discharge: 2023-12-10 | Disposition: A | Discharge status: Home / Self Care | Source: Ambulatory Visit | Attending: Pediatrics | Admitting: Pediatrics

## 2023-12-10 VITALS — BP 137/85 | HR 76 | Temp 97.6°F | Ht 63.0 in | Wt 288.0 lb

## 2023-12-10 DIAGNOSIS — Z6841 Body Mass Index (BMI) 40.0 and over, adult: Secondary | ICD-10-CM

## 2023-12-10 DIAGNOSIS — R7989 Other specified abnormal findings of blood chemistry: Secondary | ICD-10-CM | POA: Insufficient documentation

## 2023-12-10 DIAGNOSIS — Z124 Encounter for screening for malignant neoplasm of cervix: Secondary | ICD-10-CM

## 2023-12-10 DIAGNOSIS — Z0000 Encounter for general adult medical examination without abnormal findings: Secondary | ICD-10-CM | POA: Diagnosis not present

## 2023-12-10 DIAGNOSIS — R7303 Prediabetes: Secondary | ICD-10-CM

## 2023-12-10 DIAGNOSIS — Z1330 Encounter for screening examination for mental health and behavioral disorders, unspecified: Secondary | ICD-10-CM | POA: Diagnosis not present

## 2023-12-10 DIAGNOSIS — L609 Nail disorder, unspecified: Secondary | ICD-10-CM | POA: Diagnosis not present

## 2023-12-10 MED ORDER — PHENTERMINE HCL 37.5 MG PO TABS: ORAL_TABLET | ORAL | 0 refills | Status: DC

## 2023-12-10 MED ORDER — METFORMIN HCL 500 MG PO TABS: 500.0000 mg | ORAL_TABLET | Freq: Two times a day (BID) | ORAL | 3 refills | Status: AC

## 2023-12-10 NOTE — Assessment & Plan Note (Signed)
 Concerned about melanoma based on description and picture she showed me. Has nail polish today. Will plan to refer to derm to see if bx indicated.

## 2023-12-10 NOTE — Assessment & Plan Note (Signed)
 Noted on blood work, did not have repeat. May be contributing to wt abnormalities. Plan to repeat this level.

## 2023-12-10 NOTE — Patient Instructions (Addendum)
 Take half of phentermine  for 2 weeks then the full tab  I will message you regarding the thyroid  levels  You will receive a call about dermatology

## 2023-12-10 NOTE — Progress Notes (Signed)
 BP 137/85   Pulse 76   Temp 97.6 F (36.4 C) (Oral)   Ht 5' 3 (1.6 m)   Wt 288 lb (130.6 kg)   LMP  (LMP Unknown)   SpO2 96%   BMI 51.02 kg/m    Annual Physical Exam - Female  Subjective:   CC: Annual Exam   Nancy Weeks is a 28 y.o. female patient here for a preventative health maintenance exam. Additional topics discussed include:  #nail darkening Left foot pinky toe nail darkened Has always been dark but has noticed change above No known family hx of skin ca but sister was just instructed to have bx   Health Habits: DIET: in general, a healthy diet  , well balanced EXERCISE: minimal/none  DENTAL EXAM: Up to Date EYE EXAM: Up to Date                       Relevant Gynecologic History LMP: No LMP recorded (lmp unknown). (Menstrual status: Irregular Periods).  Menstrual Status: premenopausal, Flow regular every month without intermenstrual spotting PAP History:  No Cervical Cancer Screening results to display.  History abnormal PAP: No  Sexual activity: single partner, contraception - micronor  Family history breast, ovarian cancer: Yes, distant family  Domestic Violence Screen, feels safe at home: Yes  family history includes Diabetes in her father, maternal grandfather, and maternal grandmother; Other in her sister.  Social History   Tobacco Use   Smoking status: Never    Passive exposure: Never   Smokeless tobacco: Never  Vaping Use   Vaping status: Never Used  Substance Use Topics   Alcohol use: Yes    Alcohol/week: 1.0 standard drink of alcohol    Types: 1 Standard drinks or equivalent per week    Comment: Once every few weeks   Drug use: Never   Social History   Social History Narrative   Not on file    Social drivers questionnaire is reviewed and is positive for : none  Depression Screening:     05/14/2023    2:07 PM 10/17/2020    1:28 PM  Depression screen PHQ 2/9  Decreased Interest 1 1  Down, Depressed, Hopeless 1 1   PHQ - 2 Score 2 2  Altered sleeping 2 0  Tired, decreased energy 2 1  Change in appetite 1 1  Feeling bad or failure about yourself  0 1  Trouble concentrating 1 1  Moving slowly or fidgety/restless 0 0  Suicidal thoughts 0 0  PHQ-9 Score 8 6  Difficult doing work/chores Not difficult at all        05/14/2023    2:09 PM  GAD 7 : Generalized Anxiety Score  Nervous, Anxious, on Edge 0  Control/stop worrying 0  Worry too much - different things 0  Trouble relaxing 1  Restless 0  Easily annoyed or irritable 1  Afraid - awful might happen 0  Total GAD 7 Score 2  Anxiety Difficulty Not difficult at all    Mental Health Plan: CTM  Self Management Goals  Goals   None     Health Maintenance Colon Cancer Screening : Not applicable Mammogram : Not applicable DXA scan : Not applicable Immunizations : up to date and documented  Review of Systems See HPI for relevant ROS.  Outpatient Medications Prior to Visit  Medication Sig Dispense Refill   ibuprofen  (ADVIL ) 800 MG tablet Take 1 tablet (800 mg total) by mouth every 8 (eight) hours  as needed. 90 tablet 1   norethindrone  (ORTHO MICRONOR ) 0.35 MG tablet Take 1 tablet (0.35 mg total) by mouth daily. 28 tablet 11   metFORMIN  (GLUCOPHAGE ) 500 MG tablet Take 500 mg by mouth 2 (two) times daily.     No facility-administered medications prior to visit.     Patient Active Problem List   Diagnosis Date Noted   Elevated TSH 12/10/2023   Nail abnormalities 12/10/2023   Prediabetes 05/29/2023   BMI 50.0-59.9, adult (HCC) 12/25/2018    Objective:   Vitals:   12/10/23 1616  BP: 137/85  Pulse: 76  Temp: 97.6 F (36.4 C)  Height: 5' 3 (1.6 m)  Weight: 288 lb (130.6 kg)  SpO2: 96%  TempSrc: Oral  BMI (Calculated): 51.03    Body mass index is 51.02 kg/m.  Physical Exam Exam conducted with a chaperone present.  Constitutional:      Appearance: Normal appearance.  HENT:     Head: Normocephalic and atraumatic.   Eyes:     Pupils: Pupils are equal, round, and reactive to light.  Cardiovascular:     Rate and Rhythm: Normal rate and regular rhythm.     Pulses: Normal pulses.     Heart sounds: Normal heart sounds.  Pulmonary:     Effort: Pulmonary effort is normal.     Breath sounds: Normal breath sounds.  Abdominal:     General: Abdomen is flat.     Palpations: Abdomen is soft.  Genitourinary:    General: Normal vulva.     Exam position: Lithotomy position.     Vagina: Normal.     Cervix: Normal.  Musculoskeletal:        General: Normal range of motion.     Cervical back: Normal range of motion.  Skin:    General: Skin is warm and dry.     Capillary Refill: Capillary refill takes less than 2 seconds.  Neurological:     General: No focal deficit present.     Mental Status: She is alert. Mental status is at baseline.  Psychiatric:        Mood and Affect: Mood normal.        Behavior: Behavior normal.    Assessment and Plan:   Annual physical exam Discussed lifestyle modifications and goals including plant based eating styles (such as: Mediterranean eating style), regular exercise (at least 150 min of moderate-intensity aerobic exercise per week, given AHA workout handout), get adequate sleep, and continue working with PCP towards meeting health goals to ensure healthy aging.  -     Lipid panel  Prediabetes Assessment & Plan: Continue metformin , repeat A1c due. Weight management plan as below.   Orders: -     Hemoglobin A1c -     metFORMIN  HCl; Take 1 tablet (500 mg total) by mouth 2 (two) times daily with a meal.  Dispense: 180 tablet; Refill: 3 -     Phentermine  HCl; Take 0.5 tablets (18.75 mg total) by mouth daily before breakfast for 14 days, THEN 1 tablet (37.5 mg total) daily before breakfast for 21 days.  Dispense: 28 tablet; Refill: 0  BMI 50.0-59.9, adult (HCC) Assessment & Plan: Wegovy  not covered. She was previously on phentermine  and did well. Plan to restart  with 2 week titration up to prior dose.   Orders: -     Phentermine  HCl; Take 0.5 tablets (18.75 mg total) by mouth daily before breakfast for 14 days, THEN 1 tablet (37.5 mg total) daily before breakfast  for 21 days.  Dispense: 28 tablet; Refill: 0  Elevated TSH Assessment & Plan: Noted on blood work, did not have repeat. May be contributing to wt abnormalities. Plan to repeat this level.  Orders: -     Thyroid  Panel With TSH  Nail abnormalities Assessment & Plan: Concerned about melanoma based on description and picture she showed me. Has nail polish today. Will plan to refer to derm to see if bx indicated.   Orders: -     Ambulatory referral to Dermatology  Encounter for behavioral health screening As part of their intake evaluation, the patient was screened for depression, anxiety.  PHQ9 SCORE 8, GAD7 SCORE 2. Screening results negative for tested conditions. See plan under problem/diagnosis above.  Screening for cervical cancer -     Cytology - PAP    This plan was discussed with the patient and questions were answered. There were no further concerns.  Follow up as indicated, or sooner should any new problems arise, if conditions worsen, or if they are otherwise concerned.   See patient instructions for additional information.  Hadassah SHAUNNA Nett, MD  Family Medicine      Future Appointments  Date Time Provider Department Center  03/11/2024  4:20 PM Nett Hadassah SHAUNNA, MD [facility]

## 2023-12-10 NOTE — Assessment & Plan Note (Signed)
 Wegovy  not covered. She was previously on phentermine  and did well. Plan to restart with 2 week titration up to prior dose.

## 2023-12-10 NOTE — Assessment & Plan Note (Signed)
 Continue metformin , repeat A1c due. Weight management plan as below.

## 2023-12-11 LAB — LIPID PANEL
Chol/HDL Ratio: 4.4 ratio (ref 0.0–4.4)
Cholesterol, Total: 170 mg/dL (ref 100–199)
HDL: 39 mg/dL — ABNORMAL LOW (ref >39)
LDL Chol Calc (NIH): 99 mg/dL (ref 0–99)
Triglycerides: 185 mg/dL — ABNORMAL HIGH (ref 0–149)
VLDL Cholesterol Cal: 32 mg/dL (ref 5–40)

## 2023-12-11 LAB — THYROID PANEL WITH TSH
Free Thyroxine Index: 2.7 (ref 1.2–4.9)
T3 Uptake Ratio: 28 % (ref 24–39)
T4, Total: 9.5 ug/dL (ref 4.5–12.0)
TSH: 0.089 u[IU]/mL — ABNORMAL LOW (ref 0.450–4.500)

## 2023-12-11 LAB — HEMOGLOBIN A1C
Est. average glucose Bld gHb Est-mCnc: 114 mg/dL
Hgb A1c MFr Bld: 5.6 % (ref 4.8–5.6)

## 2023-12-12 LAB — CYTOLOGY - PAP: Diagnosis: NEGATIVE

## 2023-12-13 ENCOUNTER — Ambulatory Visit: Payer: Self-pay | Admitting: Pediatrics

## 2023-12-16 ENCOUNTER — Other Ambulatory Visit: Payer: Self-pay | Admitting: Pediatrics

## 2023-12-16 DIAGNOSIS — E0590 Thyrotoxicosis, unspecified without thyrotoxic crisis or storm: Secondary | ICD-10-CM

## 2024-01-09 ENCOUNTER — Other Ambulatory Visit: Payer: Self-pay | Admitting: Pediatrics

## 2024-01-09 DIAGNOSIS — Z6841 Body Mass Index (BMI) 40.0 and over, adult: Secondary | ICD-10-CM

## 2024-01-09 DIAGNOSIS — R7303 Prediabetes: Secondary | ICD-10-CM

## 2024-01-09 NOTE — Telephone Encounter (Signed)
 Requested medications are due for refill today.  yes  Requested medications are on the active medications list.  yes  Last refill. 12/10/2023 #28 0 rf  Future visit scheduled.   yes  Notes to clinic.  Refill not delegated. Rx written to expire 01/14/2024.    Requested Prescriptions  Pending Prescriptions Disp Refills   phentermine  (ADIPEX-P ) 37.5 MG tablet [Pharmacy Med Name: Phentermine  HCl 37.5 MG Oral Tablet] 28 tablet 0    Sig: TAKE ONE-HALF TABLET BY MOUTH ONCE DAILY BEFORE BREAKFAST FOR 14 DAYS, THEN INCREASE TO ONE TABLET ONCE DAILY FOR 21 DAYS     Not Delegated - Neurology: Anticonvulsants - Controlled - phentermine  hydrochloride Failed - 01/09/2024  5:30 PM      Failed - This refill cannot be delegated      Failed - eGFR in normal range and within 360 days    GFR calc Af Amer  Date Value Ref Range Status  12/29/2018 >60 >60 mL/min Final   GFR calc non Af Amer  Date Value Ref Range Status  12/29/2018 >60 >60 mL/min Final         Failed - Cr in normal range and within 360 days    Creatinine, Ser  Date Value Ref Range Status  12/29/2018 0.53 0.44 - 1.00 mg/dL Final         Passed - Last BP in normal range    BP Readings from Last 1 Encounters:  12/10/23 137/85         Passed - Valid encounter within last 6 months    Recent Outpatient Visits           1 month ago Annual physical exam   [place] [facility] Herold Hadassah SQUIBB, MD   3 months ago Pain in the coccyx   [employer] [facility] Valerio Moris T, NP   7 months ago Nexplanon  removal   [place] [facility] Herold Hadassah SQUIBB, MD              Passed - Weight completed in the last 3 months    Wt Readings from Last 1 Encounters:  12/10/23 288 lb (130.6 kg)

## 2024-02-03 DIAGNOSIS — E0590 Thyrotoxicosis, unspecified without thyrotoxic crisis or storm: Secondary | ICD-10-CM | POA: Diagnosis not present

## 2024-02-19 DIAGNOSIS — L608 Other nail disorders: Secondary | ICD-10-CM | POA: Diagnosis not present

## 2024-03-09 ENCOUNTER — Other Ambulatory Visit (HOSPITAL_COMMUNITY): Payer: Self-pay

## 2024-03-11 ENCOUNTER — Ambulatory Visit: Admitting: Pediatrics

## 2024-04-10 DIAGNOSIS — E0500 Thyrotoxicosis with diffuse goiter without thyrotoxic crisis or storm: Secondary | ICD-10-CM | POA: Diagnosis not present

## 2024-04-10 DIAGNOSIS — E0590 Thyrotoxicosis, unspecified without thyrotoxic crisis or storm: Secondary | ICD-10-CM | POA: Diagnosis not present

## 2024-05-02 ENCOUNTER — Other Ambulatory Visit: Payer: Self-pay | Admitting: Pediatrics

## 2024-05-02 DIAGNOSIS — Z309 Encounter for contraceptive management, unspecified: Secondary | ICD-10-CM

## 2024-05-02 DIAGNOSIS — Z3046 Encounter for surveillance of implantable subdermal contraceptive: Secondary | ICD-10-CM

## 2024-05-04 ENCOUNTER — Other Ambulatory Visit: Payer: Self-pay | Admitting: Pediatrics

## 2024-05-04 DIAGNOSIS — Z3046 Encounter for surveillance of implantable subdermal contraceptive: Secondary | ICD-10-CM

## 2024-05-04 DIAGNOSIS — Z309 Encounter for contraceptive management, unspecified: Secondary | ICD-10-CM

## 2024-05-04 NOTE — Telephone Encounter (Signed)
 Requested medication (s) are due for refill today: yes   Requested medication (s) are on the active medication list: yes   Last refill:  06/07/23 #28 11 refills  Future visit scheduled: yes 08/26/24   Notes to clinic:  last ordered by Dr. Herold. Do you want to continue refills?     Requested Prescriptions  Pending Prescriptions Disp Refills   norethindrone  (MICRONOR ) 0.35 MG tablet [Pharmacy Med Name: Norethindrone  0.35 MG Oral Tablet] 28 tablet 0    Sig: Take 1 tablet by mouth once daily     OB/GYN: Contraceptives - Progestins Passed - 05/04/2024  3:54 PM      Passed - Last BP in normal range    BP Readings from Last 1 Encounters:  12/10/23 137/85         Passed - Valid encounter within last 12 months    Recent Outpatient Visits           4 months ago Annual physical exam   [place] [facility] Herold Hadassah SQUIBB, MD   6 months ago Pain in the coccyx   [place] [facility] Valerio Melanie DASEN, NP   11 months ago Nexplanon  removal   [place] [facility] Herold Hadassah SQUIBB, MD              Passed - Patient is not a smoker

## 2024-05-04 NOTE — Telephone Encounter (Signed)
 Copied from CRM [id number]. Topic: Clinical - Medication Refill >> May 04, 2024  1:06 PM Hadassah PARAS wrote: Medication: norethindrone  (ORTHO MICRONOR ) 0.35 MG tablet   Has the patient contacted their pharmacy? Yes (Agent: If no, request that the patient contact the pharmacy for the refill. If patient does not wish to contact the pharmacy document the reason why and proceed with request.) (Agent: If yes, when and what did the pharmacy advise?)  This is the patient's preferred pharmacy:  [facility] [address] ([address]), [place] - [address] [place]) [place] [address] Phone: [phone] Fax: [phone]  Is this the correct pharmacy for this prescription? Yes If no, delete pharmacy and type the correct one.   Has the prescription been filled recently? Yes  Is the patient out of the medication? Yes  Has the patient been seen for an appointment in the last year OR does the patient have an upcoming appointment? Yes  Can we respond through MyChart? Yes  Agent: Please be advised that Rx refills may take up to 3 business days. We ask that you follow-up with your pharmacy.

## 2024-05-05 NOTE — Telephone Encounter (Signed)
 Requested Prescriptions  Refused Prescriptions Disp Refills   norethindrone  (MICRONOR ) 0.35 MG tablet 84 tablet 0    Sig: Take 1 tablet (0.35 mg total) by mouth daily.     OB/GYN: Contraceptives - Progestins Passed - 05/05/2024  4:51 PM      Passed - Last BP in normal range    BP Readings from Last 1 Encounters:  12/10/23 137/85         Passed - Valid encounter within last 12 months    Recent Outpatient Visits           4 months ago Annual physical exam   [place] [facility] Herold Hadassah SQUIBB, MD   6 months ago Pain in the coccyx   [place] [facility] Valerio Moris T, NP   11 months ago Nexplanon  removal    [facility] Herold Hadassah SQUIBB, MD              Passed - Patient is not a smoker

## 2024-08-26 ENCOUNTER — Encounter: Admitting: Nurse Practitioner
